# Patient Record
Sex: Female | Born: 1985 | Race: White | Hispanic: Yes | Marital: Married | Smoking: Never smoker
Health system: Southern US, Community
[De-identification: ages and names within clinical notes are randomized; demographics above are authoritative.]

## PROBLEM LIST (undated history)

## (undated) DIAGNOSIS — R000 Tachycardia, unspecified: Principal | ICD-10-CM

## (undated) DIAGNOSIS — O24419 Gestational diabetes mellitus in pregnancy, unspecified control: Secondary | ICD-10-CM

## (undated) DIAGNOSIS — R55 Syncope and collapse: Secondary | ICD-10-CM

## (undated) DIAGNOSIS — I951 Orthostatic hypotension: Principal | ICD-10-CM

## (undated) HISTORY — DX: Tachycardia, unspecified: R00.0

## (undated) HISTORY — PX: NO PAST SURGERIES: SHX2092

## (undated) HISTORY — DX: Gestational diabetes mellitus in pregnancy, unspecified control: O24.419

## (undated) HISTORY — DX: Orthostatic hypotension: I95.1

## (undated) HISTORY — DX: Syncope and collapse: R55

---

## 2004-07-30 ENCOUNTER — Inpatient Hospital Stay (HOSPITAL_COMMUNITY): Admission: AD | Admit: 2004-07-30 | Discharge: 2004-07-30 | Payer: Self-pay | Admitting: *Deleted

## 2004-12-05 DIAGNOSIS — O24419 Gestational diabetes mellitus in pregnancy, unspecified control: Secondary | ICD-10-CM

## 2004-12-05 HISTORY — DX: Gestational diabetes mellitus in pregnancy, unspecified control: O24.419

## 2005-01-21 ENCOUNTER — Encounter: Admission: RE | Admit: 2005-01-21 | Discharge: 2005-01-21 | Payer: Self-pay | Admitting: Obstetrics and Gynecology

## 2005-03-10 ENCOUNTER — Inpatient Hospital Stay (HOSPITAL_COMMUNITY): Admission: AD | Admit: 2005-03-10 | Discharge: 2005-03-10 | Payer: Self-pay | Admitting: Obstetrics & Gynecology

## 2005-03-10 ENCOUNTER — Inpatient Hospital Stay (HOSPITAL_COMMUNITY): Admission: AD | Admit: 2005-03-10 | Discharge: 2005-03-13 | Payer: Self-pay | Admitting: Obstetrics & Gynecology

## 2005-03-11 ENCOUNTER — Ambulatory Visit: Payer: Self-pay | Admitting: Certified Nurse Midwife

## 2005-05-04 LAB — SICKLE CELL SCREEN: SICKLE CELL SCREEN: NORMAL

## 2009-03-13 ENCOUNTER — Ambulatory Visit (HOSPITAL_COMMUNITY): Admission: RE | Admit: 2009-03-13 | Discharge: 2009-03-13 | Payer: Self-pay | Admitting: Family Medicine

## 2009-07-22 ENCOUNTER — Inpatient Hospital Stay (HOSPITAL_COMMUNITY): Admission: AD | Admit: 2009-07-22 | Discharge: 2009-07-24 | Payer: Self-pay | Admitting: Obstetrics & Gynecology

## 2009-07-22 ENCOUNTER — Ambulatory Visit: Payer: Self-pay | Admitting: Family

## 2011-03-12 LAB — CBC
HCT: 41 % (ref 36.0–46.0)
MCHC: 34.5 g/dL (ref 30.0–36.0)
MCV: 95.7 fL (ref 78.0–100.0)
Platelets: 192 10*3/uL (ref 150–400)
RBC: 4.28 MIL/uL (ref 3.87–5.11)
RDW: 14.1 % (ref 11.5–15.5)
WBC: 14.8 10*3/uL — ABNORMAL HIGH (ref 4.0–10.5)

## 2011-03-12 LAB — RPR: RPR Ser Ql: NONREACTIVE

## 2016-01-12 LAB — CYTOLOGY - PAP: Pap: NEGATIVE

## 2016-07-24 ENCOUNTER — Emergency Department (HOSPITAL_COMMUNITY)
Admission: EM | Admit: 2016-07-24 | Discharge: 2016-07-24 | Disposition: A | Discharge status: Home / Self Care | Payer: Self-pay | Attending: Emergency Medicine | Admitting: Emergency Medicine

## 2016-07-24 ENCOUNTER — Encounter (HOSPITAL_COMMUNITY): Payer: Self-pay | Admitting: Emergency Medicine

## 2016-07-24 DIAGNOSIS — L259 Unspecified contact dermatitis, unspecified cause: Secondary | ICD-10-CM | POA: Insufficient documentation

## 2016-07-24 MED ORDER — PREDNISONE 10 MG (21) PO TBPK: 10.0000 mg | ORAL_TABLET | Freq: Every day | ORAL | 0 refills | Status: DC

## 2016-07-24 MED ORDER — DIPHENHYDRAMINE HCL 25 MG PO TABS: 25.0000 mg | ORAL_TABLET | Freq: Four times a day (QID) | ORAL | 0 refills | Status: DC | PRN

## 2016-07-24 NOTE — Discharge Instructions (Signed)
Read the information below.  Use the prescribed medication as directed.  Please discuss all new medications with your pharmacist.  You may return to the Emergency Department at any time for worsening condition or any new symptoms that concern you.   If you develop redness, swelling, pus draining from the wound, difficulty swallowing or breathing, or fevers greater than 100.4, return to the ER immediately for a recheck.

## 2016-07-24 NOTE — ED Triage Notes (Signed)
Pt c/o generalized rash onset Monday after working out in the yard.

## 2016-07-24 NOTE — ED Notes (Signed)
Declined W/C at D/C and was escorted to lobby by RN. 

## 2016-07-24 NOTE — ED Provider Notes (Signed)
[facility] DEPT Provider Note   CSN: [id number] Arrival date & time: 07/24/16  1257  By signing my name below, I, Cassandra Turner, attest that this documentation has been prepared under the direction and in the presence of non-physician practitioner, Clayton Bibles, PA-C. Electronically Signed: Higinio Turner, Scribe. 07/24/2016. 4:33 PM.  History   Chief Complaint Chief Complaint  Patient presents with  . Rash   The history is provided by the patient. No language interpreter was used.   HPI Comments: Cassandra Turner is a 30 y.o. female who presents to the Emergency Department complaining of gradually worsening, pruritic, rash to her bilateral arms, legs and neck that began 6 days ago and worsened today. Pt reports she was outside working in her yard at the onset of her rash. She states her husband was also working with her and notes he has similar symptoms as well. She notes she has applied Ivarest topical ointment and another pink topical with no relief. She denies pain with her rash, purulent discharge, throat pain or difficulty swallowing.   History reviewed. No pertinent past medical history.  There are no active problems to display for this patient.   History reviewed. No pertinent surgical history.  OB History    No data available     Home Medications    Prior to Admission medications   Medication Sig Start Date End Date Taking? Authorizing Provider  diphenhydrAMINE (BENADRYL) 25 MG tablet Take 1 tablet (25 mg total) by mouth every 6 (six) hours as needed for itching (rash). 07/24/16   Clayton Bibles, PA-C  predniSONE (STERAPRED UNI-PAK 21 TAB) 10 MG (21) TBPK tablet Take 1 tablet (10 mg total) by mouth daily. Day 1: take 6 tabs.  Day 2: 5 tabs  Day 3: 4 tabs  Day 4: 3 tabs  Day 5: 2 tabs  Day 6: 1 tab 07/24/16   Clayton Bibles, PA-C    Family History No family history on file.  Social History Social History  Substance Use Topics  . Smoking status: Never Smoker  . Smokeless  tobacco: Never Used  . Alcohol use No   Allergies   Review of patient's allergies indicates no known allergies.  Review of Systems Review of Systems  Constitutional: Negative for chills and fever.  HENT: Negative for sore throat and trouble swallowing.   Respiratory: Negative for shortness of breath, wheezing and stridor.   Musculoskeletal: Negative for arthralgias.  Skin: Positive for rash.  Allergic/Immunologic: Negative for immunocompromised state.  Neurological: Negative for weakness and numbness.  Hematological: Does not bruise/bleed easily.  Psychiatric/Behavioral: Negative for self-injury.   Physical Exam Updated Vital Signs BP 106/74 (BP Location: Left Arm)   Pulse 78   Temp 98.1 F (36.7 C) (Oral)   Resp 18   Ht 5\' 4"  (1.626 m)   Wt 137 lb (62.1 kg)   LMP 07/24/2016   SpO2 100%   BMI 23.52 kg/m   Physical Exam  Constitutional: She appears well-developed and well-nourished. No distress.  HENT:  Head: Normocephalic and atraumatic.  Mouth/Throat: Oropharynx is clear and moist. No oropharyngeal exudate.  Eyes: Conjunctivae are normal.  Neck: Neck supple.  Cardiovascular: Normal rate and regular rhythm.   Pulmonary/Chest: Effort normal and breath sounds normal. No respiratory distress. She has no wheezes. She has no rales.  Neurological: She is alert.  Skin: She is not diaphoretic.  Diffuse erythematous plaques over upper extremities.  No discharge or tenderness.     Nursing note and vitals reviewed.  ED  Treatments / Results  Labs (all labs ordered are listed, but only abnormal results are displayed) Labs Reviewed - No data to display  EKG  EKG Interpretation None       Radiology No results found.  Procedures Procedures  DIAGNOSTIC STUDIES:  Oxygen Saturation is 100% on RA, normal by my interpretation.    COORDINATION OF CARE:  4:30 PM Discussed treatment Turner with pt at bedside and pt agreed to Turner.  Medications Ordered in ED Medications  - No data to display  Initial Impression / Assessment and Turner / ED Course  I have reviewed the triage vital signs and the nursing notes.  Pertinent labs & imaging results that were available during my care of the patient were reviewed by me and considered in my medical decision making (see chart for details).  Clinical Course    Afebrile, nontoxic patient with pruritic rash after exposure in her yard.  Husband with similar rash.  Consistent with contact dermatitis.  Has tried on topical creams.  No systemic symptoms or airway concerns.   D/C home with benadryl, prednisone.  Discussed result, findings, treatment, and follow up  with patient.  Pt given return precautions.  Pt verbalizes understanding and agrees with Turner.       I personally performed the services described in this documentation, which was scribed in my presence. The recorded information has been reviewed and is accurate.   Final Clinical Impressions(s) / ED Diagnoses   Final diagnoses:  Contact dermatitis    New Prescriptions Discharge Medication List as of 07/24/2016  4:34 PM    START taking these medications   Details  diphenhydrAMINE (BENADRYL) 25 MG tablet Take 1 tablet (25 mg total) by mouth every 6 (six) hours as needed for itching (rash)., Starting Sun 07/24/2016, Print    predniSONE (STERAPRED UNI-PAK 21 TAB) 10 MG (21) TBPK tablet Take 1 tablet (10 mg total) by mouth daily. Day 1: take 6 tabs.  Day 2: 5 tabs  Day 3: 4 tabs  Day 4: 3 tabs  Day 5: 2 tabs  Day 6: 1 tab, Starting Sun 07/24/2016, Print         [place], PA-C 07/24/16 [place], DO 07/24/16 2342

## 2016-11-21 LAB — OB RESULTS CONSOLE RPR: RPR: NONREACTIVE

## 2016-11-21 LAB — OB RESULTS CONSOLE PLATELET COUNT: PLATELETS: 345 10*3/uL

## 2016-11-21 LAB — CYSTIC FIBROSIS DIAGNOSTIC STUDY: Interpretation-CFDNA:: NEGATIVE

## 2016-11-21 LAB — OB RESULTS CONSOLE GC/CHLAMYDIA
CHLAMYDIA, DNA PROBE: NEGATIVE
GC PROBE AMP, GENITAL: NEGATIVE

## 2016-11-21 LAB — CULTURE, OB URINE: URINE CULTURE, OB: NO GROWTH

## 2016-11-21 LAB — OB RESULTS CONSOLE HGB/HCT, BLOOD
HCT: 39 %
Hemoglobin: 12.9 g/dL

## 2016-11-21 LAB — GLUCOSE, 1 HOUR: Glucose 1 Hour: 138

## 2016-11-21 LAB — OB RESULTS CONSOLE ANTIBODY SCREEN: ANTIBODY SCREEN: NEGATIVE

## 2016-11-21 LAB — OB RESULTS CONSOLE ABO/RH: RH Type: POSITIVE

## 2016-11-21 LAB — OB RESULTS CONSOLE RUBELLA ANTIBODY, IGM: Rubella: IMMUNE

## 2016-11-21 LAB — OB RESULTS CONSOLE HIV ANTIBODY (ROUTINE TESTING): HIV: NONREACTIVE

## 2016-11-21 LAB — OB RESULTS CONSOLE HEPATITIS B SURFACE ANTIGEN: Hepatitis B Surface Ag: NEGATIVE

## 2016-11-21 LAB — OB RESULTS CONSOLE VARICELLA ZOSTER ANTIBODY, IGG: Varicella: IMMUNE

## 2016-11-23 LAB — GLUCOSE, 3 HOUR

## 2016-12-05 NOTE — L&D Delivery Note (Signed)
Delivery Note At 10:11 PM a viable female was delivered via Vaginal, Spontaneous Delivery (Presentation: ROA).  APGAR: 9, 9; weight pending.   Placenta status: Spontaneous, intact.  Cord: 3 vessels  Anesthesia: epidural  Episiotomy: None Lacerations: None Suture Repair: n/a Est. Blood Loss (mL): 150  Mom to postpartum.  Baby to Couplet care / Skin to Skin.  [name] SNM 05/13/2017, 10:44 PM  Cassandra Turner is a 31 y.o. female G3P2002 with IUP at [redacted]w[redacted]d admitted for spontaneous onset of labor.  She progressed with pitocin augmentation to complete and pushed 30 minutes to deliver.  Cord clamping delayed by several minutes then clamped by CNM and cut by father of baby.  Placenta intact and spontaneous, bleeding minimal.  No laceration.   Mom and baby stable prior to transfer to postpartum. She plans on breastfeeding. She requests pills for birth control.   I was gloved and present for entire delivery SVD without incident No difficulty with shoulders No lacerations Seabron Spates, CNM

## 2016-12-09 ENCOUNTER — Encounter: Payer: Self-pay | Admitting: *Deleted

## 2016-12-12 ENCOUNTER — Ambulatory Visit: Payer: Self-pay | Admitting: *Deleted

## 2016-12-12 ENCOUNTER — Encounter: Payer: Self-pay | Attending: Obstetrics & Gynecology | Admitting: *Deleted

## 2016-12-12 DIAGNOSIS — Z3A00 Weeks of gestation of pregnancy not specified: Secondary | ICD-10-CM | POA: Insufficient documentation

## 2016-12-12 DIAGNOSIS — O24419 Gestational diabetes mellitus in pregnancy, unspecified control: Secondary | ICD-10-CM | POA: Insufficient documentation

## 2016-12-12 DIAGNOSIS — Z713 Dietary counseling and surveillance: Secondary | ICD-10-CM | POA: Insufficient documentation

## 2016-12-12 NOTE — Progress Notes (Signed)
  Patient was seen on 12/12/2016 for Gestational Diabetes self-management visit.  She is here with husband and Spanish interpretor The following learning objectives were met by the patient during this course in spanish:   States the definition of Gestational Diabetes  States why dietary management is important in controlling blood glucose  Describes the effects each nutrient has on blood glucose levels  Demonstrates ability to create a balanced meal plan  Demonstrates carbohydrate counting   States when to check blood glucose levels  Demonstrates proper blood glucose monitoring techniques  States the effect of stress and exercise on blood glucose levels  States the importance of limiting caffeine and abstaining from alcohol and smoking  Blood glucose monitor given: [employer] Lot # [id number] Exp: 03/04/2018 Blood glucose reading: 110 @ 2 hours after breakfast  Patient instructed to monitor glucose levels: FBS: 60 - <90 2 hour: <120  *Patient received handouts:  Nutrition Diabetes and Pregnancy  Carbohydrate Counting List  Patient will be seen for follow-up as needed.

## 2016-12-19 ENCOUNTER — Ambulatory Visit (INDEPENDENT_AMBULATORY_CARE_PROVIDER_SITE_OTHER): Payer: Medicaid Other | Admitting: Obstetrics & Gynecology

## 2016-12-19 ENCOUNTER — Encounter: Payer: Self-pay | Admitting: Obstetrics & Gynecology

## 2016-12-19 VITALS — BP 102/70 | HR 68 | Wt 140.3 lb

## 2016-12-19 DIAGNOSIS — O24419 Gestational diabetes mellitus in pregnancy, unspecified control: Secondary | ICD-10-CM | POA: Insufficient documentation

## 2016-12-19 DIAGNOSIS — O24912 Unspecified diabetes mellitus in pregnancy, second trimester: Secondary | ICD-10-CM

## 2016-12-19 DIAGNOSIS — O0990 Supervision of high risk pregnancy, unspecified, unspecified trimester: Secondary | ICD-10-CM | POA: Insufficient documentation

## 2016-12-19 DIAGNOSIS — Z3689 Encounter for other specified antenatal screening: Secondary | ICD-10-CM

## 2016-12-19 LAB — POCT URINALYSIS DIP (DEVICE)
Bilirubin Urine: NEGATIVE
GLUCOSE, UA: NEGATIVE mg/dL
Hgb urine dipstick: NEGATIVE
Ketones, ur: NEGATIVE mg/dL
Nitrite: NEGATIVE
PROTEIN: NEGATIVE mg/dL
SPECIFIC GRAVITY, URINE: 1.02 (ref 1.005–1.030)
UROBILINOGEN UA: 0.2 mg/dL (ref 0.0–1.0)
pH: 6 (ref 5.0–8.0)

## 2016-12-19 LAB — HEMOGLOBIN A1C
Hgb A1c MFr Bld: 5.5 % (ref <5.7)
MEAN PLASMA GLUCOSE: 111 mg/dL

## 2016-12-19 MED ORDER — ASPIRIN EC 81 MG PO TBEC: 81.0000 mg | DELAYED_RELEASE_TABLET | Freq: Every day | ORAL | 7 refills | Status: DC

## 2016-12-19 NOTE — Progress Notes (Signed)
Spanish Interpreter Lockie Mola  New ob packet given  Flu vaccine given from [facility]  Anatomy US scheduled for  January 24th @ 1030.  Pt notified. RN Zenaida Niece    PRENATAL VISIT NOTE  Subjective:  Cassandra Turner is a 31 y.o. G3P2002 at [redacted]w[redacted]d being seen today for ongoing prenatal care.  She is currently monitored for the following issues for this high-risk pregnancy and has Pregnancy, supervision, high-risk on her problem list.  Patient reports no problems.  Following diet and checking CBGs.  Contractions: Not present. Vag. Bleeding: None.  Movement: Present. Denies leaking of fluid.   The following portions of the patient's history were reviewed and updated as appropriate: allergies, current medications, past family history, past medical history, past social history, past surgical history and problem list. Problem list updated.  Objective:   Vitals:   12/19/16 0811  BP: 102/70  Pulse: 68  Weight: 140 lb 4.8 oz (63.6 kg)    Fetal Status: Fetal Heart Rate (bpm): 152   Movement: Present     General:  Alert, oriented and cooperative. Patient is in no acute distress.  Skin: Skin is warm and dry. No rash noted.   Cardiovascular: Normal heart rate noted  Respiratory: Normal respiratory effort, no problems with respiration noted  Abdomen: Soft, gravid, appropriate for gestational age. Pain/Pressure: Absent     Pelvic:  Cervical exam deferred        Extremities: Normal range of motion.  Edema: None  Mental Status: Normal mood and affect. Normal behavior. Normal judgment and thought content.   Assessment and Plan:  Pregnancy: G3P2002 at [redacted]w[redacted]d  1. Supervision of high risk pregnancy, antepartum -All fasting and pp breakfast nad lunch are WNL.  There are some 120s and 130s after dinner.  Pt encouraged to decrease carbs and take a walk after dinner meal.  - HgB A1c - AFP, Quad Screen - Baby ASA  2. Encounter for fetal anatomic survey - [place] MFM OB COMP + 14 WK; Future  Preterm  labor symptoms and general obstetric precautions including but not limited to vaginal bleeding, contractions, leaking of fluid and fetal movement were reviewed in detail with the patient. Please refer to After Visit Summary for other counseling recommendations.  Return in 3 weeks (on 01/09/2017).   Guss Bunde, MD

## 2016-12-20 ENCOUNTER — Encounter (HOSPITAL_COMMUNITY): Payer: Self-pay | Admitting: Obstetrics & Gynecology

## 2016-12-20 LAB — AFP, QUAD SCREEN
AFP: 63 ng/mL
Age Alone: 1:483 {titer}
CURR GEST AGE: 17.6 wk
Down Syndrome Scr Risk Est: <1:38500 {titer}
HCG, Total: 13.02 IU/mL
INH: 142.7 pg/mL
INTERPRETATION-AFP: NEGATIVE
MOM FOR AFP: 1.55
MoM for INH: 1.02
MoM for hCG: 0.51
Open Spina bifida: NEGATIVE
Osb Risk: 1:2420 {titer}
TRI 18 SCR RISK EST: NEGATIVE
UE3 VALUE: 1.27 ng/mL
uE3 Mom: 0.9

## 2016-12-28 ENCOUNTER — Other Ambulatory Visit: Payer: Self-pay | Admitting: Obstetrics & Gynecology

## 2016-12-28 ENCOUNTER — Encounter (HOSPITAL_COMMUNITY): Payer: Self-pay

## 2016-12-28 ENCOUNTER — Ambulatory Visit (HOSPITAL_COMMUNITY)
Admission: RE | Admit: 2016-12-28 | Discharge: 2016-12-28 | Disposition: A | Discharge status: Home / Self Care | Payer: Medicaid Other | Source: Ambulatory Visit | Attending: Obstetrics & Gynecology | Admitting: Obstetrics & Gynecology

## 2016-12-28 DIAGNOSIS — O0990 Supervision of high risk pregnancy, unspecified, unspecified trimester: Secondary | ICD-10-CM

## 2016-12-28 DIAGNOSIS — Z3689 Encounter for other specified antenatal screening: Secondary | ICD-10-CM

## 2016-12-28 DIAGNOSIS — O24410 Gestational diabetes mellitus in pregnancy, diet controlled: Secondary | ICD-10-CM | POA: Diagnosis not present

## 2016-12-28 DIAGNOSIS — Z3A18 18 weeks gestation of pregnancy: Secondary | ICD-10-CM | POA: Diagnosis not present

## 2016-12-28 DIAGNOSIS — Z363 Encounter for antenatal screening for malformations: Secondary | ICD-10-CM | POA: Insufficient documentation

## 2016-12-29 ENCOUNTER — Telehealth: Payer: Self-pay

## 2016-12-29 NOTE — Telephone Encounter (Signed)
Pt called and stated that she checked her BS and it was high so she rechecked x 3 and the values where different.  Contacted pt with [place] and pt stated that she took her BS this am and it was 140 so she rechecked it three times repeatedly after.  I asked pt what she ate she informed me two tortillas.  I explained to the pt that tortillas have carbs which could have elevated her BS.  I also explained to the pt to please check her BS the one time and obtain its value.  Please do not repeatedly check once obtained value.  Pt stated understanding with no further questions.

## 2017-01-09 ENCOUNTER — Ambulatory Visit (INDEPENDENT_AMBULATORY_CARE_PROVIDER_SITE_OTHER): Payer: Self-pay | Admitting: Obstetrics and Gynecology

## 2017-01-09 ENCOUNTER — Encounter: Payer: Self-pay | Admitting: Family Medicine

## 2017-01-09 VITALS — BP 94/54 | HR 78 | Wt 138.1 lb

## 2017-01-09 DIAGNOSIS — O24912 Unspecified diabetes mellitus in pregnancy, second trimester: Secondary | ICD-10-CM

## 2017-01-09 DIAGNOSIS — O0992 Supervision of high risk pregnancy, unspecified, second trimester: Secondary | ICD-10-CM

## 2017-01-09 DIAGNOSIS — O24419 Gestational diabetes mellitus in pregnancy, unspecified control: Secondary | ICD-10-CM

## 2017-01-09 LAB — POCT URINALYSIS DIP (DEVICE)
Bilirubin Urine: NEGATIVE
Glucose, UA: NEGATIVE mg/dL
KETONES UR: NEGATIVE mg/dL
Nitrite: NEGATIVE
PROTEIN: NEGATIVE mg/dL
SPECIFIC GRAVITY, URINE: 1.015 (ref 1.005–1.030)
UROBILINOGEN UA: 0.2 mg/dL (ref 0.0–1.0)
pH: 6.5 (ref 5.0–8.0)

## 2017-01-09 NOTE — Progress Notes (Signed)
Subjective:  Cassandra Turner is a 31 y.o. G3P2002 at [redacted]w[redacted]d being seen today for ongoing prenatal care.  She is currently monitored for the following issues for this high-risk pregnancy and has Pregnancy, supervision, high-risk and Gestational diabetes mellitus (GDM) affecting pregnancy, antepartum on her problem list.  Patient reports no complaints.  Contractions: Not present. Vag. Bleeding: None.  Movement: Present. Denies leaking of fluid.   The following portions of the patient's history were reviewed and updated as appropriate: allergies, current medications, past family history, past medical history, past social history, past surgical history and problem list. Problem list updated.  Objective:   Vitals:   01/09/17 0745  BP: (!) 94/54  Pulse: 78  Weight: 62.6 kg (138 lb 1.6 oz)    Fetal Status: Fetal Heart Rate (bpm): 154   Movement: Present     General:  Alert, oriented and cooperative. Patient is in no acute distress.  Skin: Skin is warm and dry. No rash noted.   Cardiovascular: Normal heart rate noted  Respiratory: Normal respiratory effort, no problems with respiration noted  Abdomen: Soft, gravid, appropriate for gestational age. Pain/Pressure: Absent     Pelvic:  Cervical exam deferred        Extremities: Normal range of motion.  Edema: None  Mental Status: Normal mood and affect. Normal behavior. Normal judgment and thought content.   Urinalysis: Urine Protein: Negative Urine Glucose: Negative  Assessment and Plan:  Pregnancy: G3P2002 at [redacted]w[redacted]d  1. Supervision of high risk pregnancy in second trimester Stable Continue with PNV and BASA  2. Gestational diabetes mellitus (GDM) affecting pregnancy, antepartum BS are in goal range except for a few Pp. Diet related. Importance diet reviewed with pt Interrupter used during today's visit  Preterm labor symptoms and general obstetric precautions including but not limited to vaginal bleeding, contractions, leaking of  fluid and fetal movement were reviewed in detail with the patient. Please refer to After Visit Summary for other counseling recommendations.  Return in about 4 weeks (around 02/06/2017) for OB visit.   Chancy Milroy, MD

## 2017-01-09 NOTE — Progress Notes (Signed)
Spanish interpreter Mikle Bosworth present for visit

## 2017-01-09 NOTE — Patient Instructions (Signed)

## 2017-02-08 ENCOUNTER — Ambulatory Visit (INDEPENDENT_AMBULATORY_CARE_PROVIDER_SITE_OTHER): Payer: Self-pay | Admitting: Obstetrics and Gynecology

## 2017-02-08 VITALS — BP 110/65 | HR 85 | Wt 141.6 lb

## 2017-02-08 DIAGNOSIS — O24419 Gestational diabetes mellitus in pregnancy, unspecified control: Secondary | ICD-10-CM

## 2017-02-08 DIAGNOSIS — O24919 Unspecified diabetes mellitus in pregnancy, unspecified trimester: Secondary | ICD-10-CM

## 2017-02-08 DIAGNOSIS — O0992 Supervision of high risk pregnancy, unspecified, second trimester: Secondary | ICD-10-CM

## 2017-02-08 LAB — POCT URINALYSIS DIP (DEVICE)
Bilirubin Urine: NEGATIVE
GLUCOSE, UA: NEGATIVE mg/dL
HGB URINE DIPSTICK: NEGATIVE
KETONES UR: NEGATIVE mg/dL
Leukocytes, UA: NEGATIVE
Nitrite: NEGATIVE
PROTEIN: NEGATIVE mg/dL
SPECIFIC GRAVITY, URINE: 1.01 (ref 1.005–1.030)
Urobilinogen, UA: 0.2 mg/dL (ref 0.0–1.0)
pH: 6 (ref 5.0–8.0)

## 2017-02-08 MED ORDER — GLYBURIDE 2.5 MG PO TABS: 2.5000 mg | ORAL_TABLET | Freq: Every day | ORAL | 3 refills | Status: DC

## 2017-02-08 NOTE — Patient Instructions (Signed)
Segundo trimestre de embarazo (Second Trimester of Pregnancy) El segundo trimestre va desde la semana13 hasta la 28, desde el cuarto hasta el sexto mes, y suele ser el momento en el que mejor se siente. En general, las nuseas matutinas han disminuido o han desaparecido completamente. Tendr ms energa y podr aumentarle el apetito. El beb por nacer (feto) se desarrolla rpidamente. Hacia el final del sexto mes, el beb mide aproximadamente 9 pulgadas (23 cm) y pesa alrededor de 1 libras (700 g). Es probable que sienta al beb moverse (dar pataditas) entre las 18 y 20 semanas del embarazo. CUIDADOS EN EL HOGAR  No fume, no consuma hierbas ni beba alcohol. No tome frmacos que el mdico no haya autorizado.  No consuma ningn producto que contenga tabaco, lo que incluye cigarrillos, tabaco de mascar o cigarrillos electrnicos. Si necesita ayuda para dejar de fumar, consulte al mdico. Puede recibir asesoramiento u otro tipo de apoyo para dejar de fumar.  Tome los medicamentos solamente como se lo haya indicado el mdico. Algunos medicamentos son seguros para tomar durante el embarazo y otros no lo son.  Haga ejercicios solamente como se lo haya indicado el mdico. Interrumpa la actividad fsica si comienza a tener calambres.  Ingiera alimentos saludables de manera regular.  Use un sostn que le brinde buen soporte si sus mamas estn sensibles.  No se d baos de inmersin en agua caliente, baos turcos ni saunas.  Colquese el cinturn de seguridad cuando conduzca.  No coma carne cruda ni queso sin cocinar; evite el contacto con las bandejas sanitarias de los gatos y la tierra que estos animales usan.  Tome las vitaminas prenatales.  Tome entre 1500 y 2000mg de calcio diariamente comenzando en la semana20 del embarazo hasta el parto.  Pruebe tomar un medicamento que la ayude a defecar (un laxante suave) si el mdico lo autoriza. Consuma ms fibra, que se encuentra en las frutas y  verduras frescas y los cereales integrales. Beba suficiente lquido para mantener el pis (orina) claro o de color amarillo plido.  Dese baos de asiento con agua tibia para aliviar el dolor o las molestias causadas por las hemorroides. Use una crema para las hemorroides si el mdico la autoriza.  Si se le hinchan las venas (venas varicosas), use medias de descanso. Levante (eleve) los pies durante 15minutos, 3 o 4veces por da. Limite el consumo de sal en su dieta.  No levante objetos pesados, use zapatos de tacones bajos y sintese derecha.  Descanse con las piernas elevadas si tiene calambres o dolor de cintura.  Visite a su dentista si no lo ha hecho durante el embarazo. Use un cepillo de cerdas suaves para cepillarse los dientes. Psese el hilo dental con suavidad.  Puede seguir manteniendo relaciones sexuales, a menos que el mdico le indique lo contrario.  Concurra a los controles mdicos.  SOLICITE AYUDA SI:  Siente mareos.  Sufre calambres o presin leves en la parte baja del vientre (abdomen).  Sufre un dolor persistente en el abdomen.  Tiene malestar estomacal (nuseas), vmitos, o tiene deposiciones acuosas (diarrea).  Advierte un olor ftido que proviene de la vagina.  Siente dolor al orinar.  SOLICITE AYUDA DE INMEDIATO SI:  Tiene fiebre.  Tiene una prdida de lquido por la vagina.  Tiene sangrado o pequeas prdidas vaginales.  Siente dolor intenso o clicos en el abdomen.  Sube o baja de peso rpidamente.  Tiene dificultades para recuperar el aliento y siente dolor en el pecho.  Sbitamente se   le hinchan mucho el rostro, las manos, los tobillos, los pies o las piernas.  No ha sentido los movimientos del beb durante una hora.  Siente un dolor de cabeza intenso que no se alivia con medicamentos.  Su visin se modifica.  Esta informacin no tiene como fin reemplazar el consejo del mdico. Asegrese de hacerle al mdico cualquier pregunta que  tenga. Document Released: 07/24/2013 Document Revised: 12/12/2014 Document Reviewed: 01/22/2013 Elsevier Interactive Patient Education  2017 Elsevier Inc.  

## 2017-02-08 NOTE — Progress Notes (Signed)
Video Interpreter # [phone]

## 2017-02-08 NOTE — Progress Notes (Signed)
Subjective:  Cassandra Turner is a 31 y.o. G3P2002 at [redacted]w[redacted]d being seen today for ongoing prenatal care.  She is currently monitored for the following issues for this high-risk pregnancy and has Pregnancy, supervision, high-risk and Gestational diabetes mellitus (GDM) affecting pregnancy, antepartum on her problem list.  Patient reports no complaints.  Contractions: Not present. Vag. Bleeding: None.  Movement: Present. Denies leaking of fluid.   The following portions of the patient's history were reviewed and updated as appropriate: allergies, current medications, past family history, past medical history, past social history, past surgical history and problem list. Problem list updated.  Objective:   Vitals:   02/08/17 0843  BP: 110/65  Pulse: 85  Weight: 141 lb 9.6 oz (64.2 kg)    Fetal Status: Fetal Heart Rate (bpm): 153   Movement: Present     General:  Alert, oriented and cooperative. Patient is in no acute distress.  Skin: Skin is warm and dry. No rash noted.   Cardiovascular: Normal heart rate noted  Respiratory: Normal respiratory effort, no problems with respiration noted  Abdomen: Soft, gravid, appropriate for gestational age. Pain/Pressure: Absent     Pelvic:  Cervical exam deferred        Extremities: Normal range of motion.  Edema: None  Mental Status: Normal mood and affect. Normal behavior. Normal judgment and thought content.   Urinalysis:      Assessment and Plan:  Pregnancy: G3P2002 at [redacted]w[redacted]d  1. Supervision of high risk pregnancy in second trimester  - glyBURIDE (DIABETA) 2.5 MG tablet; Take 1 tablet (2.5 mg total) by mouth daily with breakfast.  Dispense: 60 tablet; Refill: 3  2. Gestational diabetes mellitus (GDM) affecting pregnancy, antepartum 2 hr PP BS have increased from last visit Pt reports following diet Will start Diabeta as noted above  Preterm labor symptoms and general obstetric precautions including but not limited to vaginal bleeding,  contractions, leaking of fluid and fetal movement were reviewed in detail with the patient. Please refer to After Visit Summary for other counseling recommendations.  Return in about 4 weeks (around 03/08/2017) for OB visit.   Chancy Milroy, MD

## 2017-03-08 ENCOUNTER — Ambulatory Visit (INDEPENDENT_AMBULATORY_CARE_PROVIDER_SITE_OTHER): Payer: Self-pay | Admitting: Obstetrics and Gynecology

## 2017-03-08 VITALS — BP 96/62 | HR 76 | Wt 145.5 lb

## 2017-03-08 DIAGNOSIS — O24415 Gestational diabetes mellitus in pregnancy, controlled by oral hypoglycemic drugs: Secondary | ICD-10-CM

## 2017-03-08 DIAGNOSIS — O0993 Supervision of high risk pregnancy, unspecified, third trimester: Secondary | ICD-10-CM

## 2017-03-08 DIAGNOSIS — Z23 Encounter for immunization: Secondary | ICD-10-CM

## 2017-03-08 LAB — POCT URINALYSIS DIP (DEVICE)
Bilirubin Urine: NEGATIVE
GLUCOSE, UA: NEGATIVE mg/dL
Hgb urine dipstick: NEGATIVE
KETONES UR: NEGATIVE mg/dL
LEUKOCYTES UA: NEGATIVE
Nitrite: NEGATIVE
Protein, ur: NEGATIVE mg/dL
SPECIFIC GRAVITY, URINE: 1.015 (ref 1.005–1.030)
UROBILINOGEN UA: 0.2 mg/dL (ref 0.0–1.0)
pH: 6.5 (ref 5.0–8.0)

## 2017-03-08 NOTE — Progress Notes (Signed)
28 week labs, tdap today Spanish video interpreter "Katherin" [phone] used for visit

## 2017-03-08 NOTE — Progress Notes (Signed)
   PRENATAL VISIT NOTE  Subjective:  Cassandra Turner is a 31 y.o. G3P2002 at [redacted]w[redacted]d being seen today for ongoing prenatal care.  She is currently monitored for the following issues for this high-risk pregnancy and has Pregnancy, supervision, high-risk and Gestational diabetes mellitus (GDM) affecting pregnancy, antepartum on her problem list.    Patient reports no complaints.  Contractions: Not present. Vag. Bleeding: None.  Movement: Present. Denies leaking of fluid.  Reviewed log book> all CBGs within target range. On Glyburide 2.5mg  q am.   The following portions of the patient's history were reviewed and updated as appropriate: allergies, current medications, past family history, past medical history, past social history, past surgical history and problem list. Problem list updated.                Objective:   Vitals:   03/08/17 0756  BP: 96/62  Pulse: 76  Weight: 145 lb 8 oz (66 kg)    Fetal Status: Fetal Heart Rate (bpm): 145   Movement: Present     General:  Alert, oriented and cooperative. Patient is in no acute distress.  Skin: Skin is warm and dry. No rash noted.   Cardiovascular: Normal heart rate noted  Respiratory: Normal respiratory effort, no problems with respiration noted  Abdomen: Soft, gravid, appropriate for gestational age. Pain/Pressure: Present     Pelvic:  Cervical exam deferred        Extremities: Normal range of motion.  Edema: None  Mental Status: Normal mood and affect. Normal behavior. Normal judgment and thought content.   Assessment and Plan:  Pregnancy: G3P2002 at [redacted]w[redacted]d  1. Supervision of high risk pregnancy in third trimester Doing well - CBC - HIV antibody (with reflex) - RPR  2. Need for Tdap vaccination  - Tdap vaccine greater than or equal to 7yo IM  3. A2 GDM Continue Glyburide  Preterm labor symptoms and general obstetric precautions including but not limited to vaginal bleeding, contractions, leaking of fluid  and fetal movement were reviewed in detail with the patient. Please refer to After Visit Summary for other counseling recommendations.  Return in about 1 week (around 03/15/2017).   Lorene Dy, CNM

## 2017-03-08 NOTE — Patient Instructions (Signed)
Third Trimester of Pregnancy The third trimester is from week 28 through week 40 (months 7 through 9). The third trimester is a time when the unborn baby (fetus) is growing rapidly. At the end of the ninth month, the fetus is about 20 inches in length and weighs 6-10 pounds. Body changes during your third trimester Your body will continue to go through many changes during pregnancy. The changes vary from woman to woman. During the third trimester:  Your weight will continue to increase. You can expect to gain 25-35 pounds (11-16 kg) by the end of the pregnancy.  You may begin to get stretch marks on your hips, abdomen, and breasts.  You may urinate more often because the fetus is moving lower into your pelvis and pressing on your bladder.  You may develop or continue to have heartburn. This is caused by increased hormones that slow down muscles in the digestive tract.  You may develop or continue to have constipation because increased hormones slow digestion and cause the muscles that push waste through your intestines to relax.  You may develop hemorrhoids. These are swollen veins (varicose veins) in the rectum that can itch or be painful.  You may develop swollen, bulging veins (varicose veins) in your legs.  You may have increased body aches in the pelvis, back, or thighs. This is due to weight gain and increased hormones that are relaxing your joints.  You may have changes in your hair. These can include thickening of your hair, rapid growth, and changes in texture. Some women also have hair loss during or after pregnancy, or hair that feels dry or thin. Your hair will most likely return to normal after your baby is born.  Your breasts will continue to grow and they will continue to become tender. A yellow fluid (colostrum) may leak from your breasts. This is the first milk you are producing for your baby.  Your belly button may stick out.  You may notice more swelling in your hands,  face, or ankles.  You may have increased tingling or numbness in your hands, arms, and legs. The skin on your belly may also feel numb.  You may feel short of breath because of your expanding uterus.  You may have more problems sleeping. This can be caused by the size of your belly, increased need to urinate, and an increase in your body's metabolism.  You may notice the fetus "dropping," or moving lower in your abdomen (lightening).  You may have increased vaginal discharge.  You may notice your joints feel loose and you may have pain around your pelvic bone.  What to expect at prenatal visits You will have prenatal exams every 2 weeks until week 36. Then you will have weekly prenatal exams. During a routine prenatal visit:  You will be weighed to make sure you and the baby are growing normally.  Your blood pressure will be taken.  Your abdomen will be measured to track your baby's growth.  The fetal heartbeat will be listened to.  Any test results from the previous visit will be discussed.  You may have a cervical check near your due date to see if your cervix has softened or thinned (effaced).  You will be tested for Group B streptococcus. This happens between 35 and 37 weeks.  Your health care provider may ask you:  What your birth plan is.  How you are feeling.  If you are feeling the baby move.  If you have had   any abnormal symptoms, such as leaking fluid, bleeding, severe headaches, or abdominal cramping.  If you are using any tobacco products, including cigarettes, chewing tobacco, and electronic cigarettes.  If you have any questions.  Other tests or screenings that may be performed during your third trimester include:  Blood tests that check for low iron levels (anemia).  Fetal testing to check the health, activity level, and growth of the fetus. Testing is done if you have certain medical conditions or if there are problems during the  pregnancy.  Nonstress test (NST). This test checks the health of your baby to make sure there are no signs of problems, such as the baby not getting enough oxygen. During this test, a belt is placed around your belly. The baby is made to move, and its heart rate is monitored during movement.  What is false labor? False labor is a condition in which you feel small, irregular tightenings of the muscles in the womb (contractions) that usually go away with rest, changing position, or drinking water. These are called Braxton Hicks contractions. Contractions may last for hours, days, or even weeks before true labor sets in. If contractions come at regular intervals, become more frequent, increase in intensity, or become painful, you should see your health care provider. What are the signs of labor?  Abdominal cramps.  Regular contractions that start at 10 minutes apart and become stronger and more frequent with time.  Contractions that start on the top of the uterus and spread down to the lower abdomen and back.  Increased pelvic pressure and dull back pain.  A watery or bloody mucus discharge that comes from the vagina.  Leaking of amniotic fluid. This is also known as your "water breaking." It could be a slow trickle or a gush. Let your health care provider know if it has a color or strange odor. If you have any of these signs, call your health care provider right away, even if it is before your due date. Follow these instructions at home: Medicines  Follow your health care provider's instructions regarding medicine use. Specific medicines may be either safe or unsafe to take during pregnancy.  Take a prenatal vitamin that contains at least 600 micrograms (mcg) of folic acid.  If you develop constipation, try taking a stool softener if your health care provider approves. Eating and drinking  Eat a balanced diet that includes fresh fruits and vegetables, whole grains, good sources of protein  such as meat, eggs, or tofu, and low-fat dairy. Your health care provider will help you determine the amount of weight gain that is right for you.  Avoid raw meat and uncooked cheese. These carry germs that can cause birth defects in the baby.  If you have low calcium intake from food, talk to your health care provider about whether you should take a daily calcium supplement.  Eat four or five small meals rather than three large meals a day.  Limit foods that are high in fat and processed sugars, such as fried and sweet foods.  To prevent constipation: ? Drink enough fluid to keep your urine clear or pale yellow. ? Eat foods that are high in fiber, such as fresh fruits and vegetables, whole grains, and beans. Activity  Exercise only as directed by your health care provider. Most women can continue their usual exercise routine during pregnancy. Try to exercise for 30 minutes at least 5 days a week. Stop exercising if you experience uterine contractions.  Avoid heavy   lifting.  Do not exercise in extreme heat or humidity, or at high altitudes.  Wear low-heel, comfortable shoes.  Practice good posture.  You may continue to have sex unless your health care provider tells you otherwise. Relieving pain and discomfort  Take frequent breaks and rest with your legs elevated if you have leg cramps or low back pain.  Take warm sitz baths to soothe any pain or discomfort caused by hemorrhoids. Use hemorrhoid cream if your health care provider approves.  Wear a good support bra to prevent discomfort from breast tenderness.  If you develop varicose veins: ? Wear support pantyhose or compression stockings as told by your healthcare provider. ? Elevate your feet for 15 minutes, 3-4 times a day. Prenatal care  Write down your questions. Take them to your prenatal visits.  Keep all your prenatal visits as told by your health care provider. This is important. Safety  Wear your seat belt at  all times when driving.  Make a list of emergency phone numbers, including numbers for family, friends, the hospital, and police and fire departments. General instructions  Avoid cat litter boxes and soil used by cats. These carry germs that can cause birth defects in the baby. If you have a cat, ask someone to clean the litter box for you.  Do not travel far distances unless it is absolutely necessary and only with the approval of your health care provider.  Do not use hot tubs, steam rooms, or saunas.  Do not drink alcohol.  Do not use any products that contain nicotine or tobacco, such as cigarettes and e-cigarettes. If you need help quitting, ask your health care provider.  Do not use any medicinal herbs or unprescribed drugs. These chemicals affect the formation and growth of the baby.  Do not douche or use tampons or scented sanitary pads.  Do not cross your legs for long periods of time.  To prepare for the arrival of your baby: ? Take prenatal classes to understand, practice, and ask questions about labor and delivery. ? Make a trial run to the hospital. ? Visit the hospital and tour the maternity area. ? Arrange for maternity or paternity leave through employers. ? Arrange for family and friends to take care of pets while you are in the hospital. ? Purchase a rear-facing car seat and make sure you know how to install it in your car. ? Pack your hospital bag. ? Prepare the baby's nursery. Make sure to remove all pillows and stuffed animals from the baby's crib to prevent suffocation.  Visit your dentist if you have not gone during your pregnancy. Use a soft toothbrush to brush your teeth and be gentle when you floss. Contact a health care provider if:  You are unsure if you are in labor or if your water has broken.  You become dizzy.  You have mild pelvic cramps, pelvic pressure, or nagging pain in your abdominal area.  You have lower back pain.  You have persistent  nausea, vomiting, or diarrhea.  You have an unusual or bad smelling vaginal discharge.  You have pain when you urinate. Get help right away if:  Your water breaks before 37 weeks.  You have regular contractions less than 5 minutes apart before 37 weeks.  You have a fever.  You are leaking fluid from your vagina.  You have spotting or bleeding from your vagina.  You have severe abdominal pain or cramping.  You have rapid weight loss or weight gain.    You have shortness of breath with chest pain.  You notice sudden or extreme swelling of your face, hands, ankles, feet, or legs.  Your baby makes fewer than 10 movements in 2 hours.  You have severe headaches that do not go away when you take medicine.  You have vision changes. Summary  The third trimester is from week 28 through week 40, months 7 through 9. The third trimester is a time when the unborn baby (fetus) is growing rapidly.  During the third trimester, your discomfort may increase as you and your baby continue to gain weight. You may have abdominal, leg, and back pain, sleeping problems, and an increased need to urinate.  During the third trimester your breasts will keep growing and they will continue to become tender. A yellow fluid (colostrum) may leak from your breasts. This is the first milk you are producing for your baby.  False labor is a condition in which you feel small, irregular tightenings of the muscles in the womb (contractions) that eventually go away. These are called Braxton Hicks contractions. Contractions may last for hours, days, or even weeks before true labor sets in.  Signs of labor can include: abdominal cramps; regular contractions that start at 10 minutes apart and become stronger and more frequent with time; watery or bloody mucus discharge that comes from the vagina; increased pelvic pressure and dull back pain; and leaking of amniotic fluid. This information is not intended to replace advice  given to you by your health care provider. Make sure you discuss any questions you have with your health care provider. Document Released: 11/15/2001 Document Revised: 04/28/2016 Document Reviewed: 01/22/2013 Elsevier Interactive Patient Education  2017 [employer]  

## 2017-03-09 LAB — CBC
HEMATOCRIT: 35.6 % (ref 34.0–46.6)
Hemoglobin: 12.2 g/dL (ref 11.1–15.9)
MCH: 32.2 pg (ref 26.6–33.0)
MCHC: 34.3 g/dL (ref 31.5–35.7)
MCV: 94 fL (ref 79–97)
Platelets: 276 10*3/uL (ref 150–379)
RBC: 3.79 x10E6/uL (ref 3.77–5.28)
RDW: 14.7 % (ref 12.3–15.4)
WBC: 8.3 10*3/uL (ref 3.4–10.8)

## 2017-03-09 LAB — RPR: RPR: NONREACTIVE

## 2017-03-09 LAB — HIV ANTIBODY (ROUTINE TESTING W REFLEX): HIV Screen 4th Generation wRfx: NONREACTIVE

## 2017-03-20 ENCOUNTER — Ambulatory Visit (INDEPENDENT_AMBULATORY_CARE_PROVIDER_SITE_OTHER): Payer: Self-pay | Admitting: Family Medicine

## 2017-03-20 VITALS — BP 102/68 | HR 79 | Wt 150.9 lb

## 2017-03-20 DIAGNOSIS — O24415 Gestational diabetes mellitus in pregnancy, controlled by oral hypoglycemic drugs: Secondary | ICD-10-CM

## 2017-03-20 DIAGNOSIS — O0993 Supervision of high risk pregnancy, unspecified, third trimester: Secondary | ICD-10-CM

## 2017-03-20 NOTE — Progress Notes (Signed)
Spanish interpreter Earnest Bailey present for visit

## 2017-03-20 NOTE — Progress Notes (Signed)
Subjective:  Cassandra Turner is a 31 y.o. G3P2002 at [redacted]w[redacted]d being seen today for ongoing prenatal care.  She is currently monitored for the following issues for this high-risk pregnancy and has Pregnancy, supervision, high-risk and Gestational diabetes mellitus (GDM) affecting pregnancy, antepartum on her problem list.  GDM: Patient taking glyburide 2.5mg  qAM.  Reports no hypoglycemic episodes.  Tolerating medication well Fasting: 80-98 (most elevated) 2hr PP: controlled  Patient reports no complaints.  Contractions: Not present. Vag. Bleeding: None.  Movement: Present. Denies leaking of fluid.   The following portions of the patient's history were reviewed and updated as appropriate: allergies, current medications, past family history, past medical history, past social history, past surgical history and problem list. Problem list updated.  Objective:   Vitals:   03/20/17 1404  BP: 102/68  Pulse: 79  Weight: 150 lb 14.4 oz (68.4 kg)    Fetal Status: Fetal Heart Rate (bpm): 160   Movement: Present     General:  Alert, oriented and cooperative. Patient is in no acute distress.  Skin: Skin is warm and dry. No rash noted.   Cardiovascular: Normal heart rate noted  Respiratory: Normal respiratory effort, no problems with respiration noted  Abdomen: Soft, gravid, appropriate for gestational age. Pain/Pressure: Absent     Pelvic: Vag. Bleeding: None     Cervical exam deferred        Extremities: Normal range of motion.  Edema: None  Mental Status: Normal mood and affect. Normal behavior. Normal judgment and thought content.   Urinalysis:      Assessment and Plan:  Pregnancy: G3P2002 at [redacted]w[redacted]d  1. Supervision of high risk pregnancy in third trimester FHT and FH normal  2. Gestational diabetes mellitus (GDM) in third trimester controlled on oral hypoglycemic drug Increase glyburide to 2.5mg  BID. Start twice weekly testing at 32 weeks.  Preterm labor symptoms and general  obstetric precautions including but not limited to vaginal bleeding, contractions, leaking of fluid and fetal movement were reviewed in detail with the patient. Please refer to After Visit Summary for other counseling recommendations.  No Follow-up on file.   Truett Mainland, DO

## 2017-03-30 ENCOUNTER — Ambulatory Visit (INDEPENDENT_AMBULATORY_CARE_PROVIDER_SITE_OTHER): Payer: Self-pay | Admitting: Obstetrics and Gynecology

## 2017-03-30 ENCOUNTER — Ambulatory Visit: Payer: Self-pay

## 2017-03-30 DIAGNOSIS — O24415 Gestational diabetes mellitus in pregnancy, controlled by oral hypoglycemic drugs: Secondary | ICD-10-CM

## 2017-03-30 NOTE — Progress Notes (Signed)
Pt informed that the ultrasound is considered a limited OB ultrasound and is not intended to be a complete ultrasound exam.  Patient also informed that the ultrasound is not being completed with the intent of assessing for fetal or placental anomalies or any pelvic abnormalities.  Explained that the purpose of today's ultrasound is to assess for presentation and amniotic fluid volume.  Patient acknowledges the purpose of the exam and the limitations of the study.    

## 2017-03-31 NOTE — Progress Notes (Signed)
NST Note Date: 03/30/2017 Gestational Age: 34/0 weeks FHT: 135 baseline, positive accelerations, negative deceleration, moderate variability Toco: one UC Time: 25 minutes  A/P: rNST, AFI, vertex. Continue current plan of care  Durene Romans MD Attending Center for [facility])

## 2017-04-03 ENCOUNTER — Ambulatory Visit (INDEPENDENT_AMBULATORY_CARE_PROVIDER_SITE_OTHER): Payer: Self-pay | Admitting: Obstetrics and Gynecology

## 2017-04-03 VITALS — BP 110/67 | HR 76 | Wt 150.1 lb

## 2017-04-03 DIAGNOSIS — O0993 Supervision of high risk pregnancy, unspecified, third trimester: Secondary | ICD-10-CM

## 2017-04-03 DIAGNOSIS — O0992 Supervision of high risk pregnancy, unspecified, second trimester: Secondary | ICD-10-CM

## 2017-04-03 DIAGNOSIS — O24415 Gestational diabetes mellitus in pregnancy, controlled by oral hypoglycemic drugs: Secondary | ICD-10-CM

## 2017-04-03 DIAGNOSIS — O24419 Gestational diabetes mellitus in pregnancy, unspecified control: Secondary | ICD-10-CM

## 2017-04-03 DIAGNOSIS — Z603 Acculturation difficulty: Secondary | ICD-10-CM

## 2017-04-03 DIAGNOSIS — Z789 Other specified health status: Secondary | ICD-10-CM

## 2017-04-03 LAB — POCT URINALYSIS DIP (DEVICE)
Bilirubin Urine: NEGATIVE
Glucose, UA: NEGATIVE mg/dL
HGB URINE DIPSTICK: NEGATIVE
Ketones, ur: NEGATIVE mg/dL
Leukocytes, UA: NEGATIVE
NITRITE: NEGATIVE
Protein, ur: NEGATIVE mg/dL
Specific Gravity, Urine: <=1.005 (ref 1.005–1.030)
UROBILINOGEN UA: 0.2 mg/dL (ref 0.0–1.0)
pH: 5.5 (ref 5.0–8.0)

## 2017-04-03 MED ORDER — GLYBURIDE 2.5 MG PO TABS: ORAL_TABLET | ORAL | 3 refills | Status: DC

## 2017-04-03 NOTE — Progress Notes (Signed)
Prenatal Visit Note Date: 04/03/2017 Clinic: [facility]  Subjective:  Cassandra Turner is a 31 y.o. G3P2002 at [redacted]w[redacted]d being seen today for ongoing prenatal care.  She is currently monitored for the following issues for this low-risk pregnancy and has Pregnancy, supervision, high-risk and Gestational diabetes mellitus (GDM) affecting pregnancy, antepartum on her problem list.  Patient reports no complaints.   Contractions: Not present. Vag. Bleeding: None.  Movement: Present. Denies leaking of fluid.   The following portions of the patient's history were reviewed and updated as appropriate: allergies, current medications, past family history, past medical history, past social history, past surgical history and problem list. Problem list updated.  Objective:   Vitals:   04/03/17 1310  BP: 110/67  Pulse: 76  Weight: 150 lb 1.6 oz (68.1 kg)    Fetal Status: Fetal Heart Rate (bpm): rNST   Movement: Present  Presentation: Vertex  General:  Alert, oriented and cooperative. Patient is in no acute distress.  Skin: Skin is warm and dry. No rash noted.   Cardiovascular: Normal heart rate noted  Respiratory: Normal respiratory effort, no problems with respiration noted  Abdomen: Soft, gravid, appropriate for gestational age. Pain/Pressure: Absent     Pelvic:  Cervical exam deferred        Extremities: Normal range of motion.  Edema: None  Mental Status: Normal mood and affect. Normal behavior. Normal judgment and thought content.   Urinalysis: Urine Protein: Negative Urine Glucose: Negative  Assessment and Plan:  Pregnancy: G3P2002 at [redacted]w[redacted]d  1. Gestational diabetes mellitus (GDM) in third trimester controlled on oral hypoglycemic drug Normal BS log with glyburide 2.5/2.5. rNST today. Continue with 2x/week testing (NST/AFI later this week) - Fetal nonstress test  2. Supervision of high risk pregnancy in third trimester Routine care. D/w pt re: BC nv.    Interpreter used.   Preterm labor symptoms and general obstetric precautions including but not limited to vaginal bleeding, contractions, leaking of fluid and fetal movement were reviewed in detail with the patient. Please refer to After Visit Summary for other counseling recommendations.     Aletha Halim, MD

## 2017-04-07 ENCOUNTER — Ambulatory Visit (INDEPENDENT_AMBULATORY_CARE_PROVIDER_SITE_OTHER): Payer: Self-pay | Admitting: Obstetrics & Gynecology

## 2017-04-07 ENCOUNTER — Ambulatory Visit: Payer: Self-pay

## 2017-04-07 VITALS — BP 105/66 | HR 70

## 2017-04-07 DIAGNOSIS — O24415 Gestational diabetes mellitus in pregnancy, controlled by oral hypoglycemic drugs: Secondary | ICD-10-CM

## 2017-04-10 ENCOUNTER — Ambulatory Visit (INDEPENDENT_AMBULATORY_CARE_PROVIDER_SITE_OTHER): Payer: Self-pay | Admitting: Obstetrics & Gynecology

## 2017-04-10 VITALS — BP 110/63 | HR 79

## 2017-04-10 DIAGNOSIS — O24415 Gestational diabetes mellitus in pregnancy, controlled by oral hypoglycemic drugs: Secondary | ICD-10-CM

## 2017-04-10 NOTE — Progress Notes (Signed)
NST 5/4 reactive

## 2017-04-14 ENCOUNTER — Ambulatory Visit: Payer: Self-pay

## 2017-04-14 ENCOUNTER — Ambulatory Visit (INDEPENDENT_AMBULATORY_CARE_PROVIDER_SITE_OTHER): Payer: Self-pay | Admitting: Obstetrics & Gynecology

## 2017-04-14 VITALS — BP 112/64 | HR 92

## 2017-04-14 DIAGNOSIS — O24415 Gestational diabetes mellitus in pregnancy, controlled by oral hypoglycemic drugs: Secondary | ICD-10-CM

## 2017-04-14 NOTE — Progress Notes (Signed)
Pt informed that the ultrasound is considered a limited OB ultrasound and is not intended to be a complete ultrasound exam.  Patient also informed that the ultrasound is not being completed with the intent of assessing for fetal or placental anomalies or any pelvic abnormalities.  Explained that the purpose of today's ultrasound is to assess for presentation and amniotic fluid volume.  Patient acknowledges the purpose of the exam and the limitations of the study.   [place] for growth scheduled 6/8 @ 0800.

## 2017-04-17 ENCOUNTER — Ambulatory Visit (INDEPENDENT_AMBULATORY_CARE_PROVIDER_SITE_OTHER): Payer: Self-pay | Admitting: Obstetrics and Gynecology

## 2017-04-17 VITALS — BP 89/59 | HR 75 | Wt 154.0 lb

## 2017-04-17 DIAGNOSIS — O24415 Gestational diabetes mellitus in pregnancy, controlled by oral hypoglycemic drugs: Secondary | ICD-10-CM

## 2017-04-18 NOTE — Progress Notes (Signed)
NST Note Date: 04/17/2016 Gestational Age: 33/3 FHT: 150 baseline, positive accelerations, negative deceleration, moderate variability Toco: quiet Time: 25 minutes  A/P: rNST. Continue current plan of care  Durene Romans MD Attending Center for [facility])

## 2017-04-21 ENCOUNTER — Ambulatory Visit: Payer: Self-pay

## 2017-04-21 ENCOUNTER — Ambulatory Visit (INDEPENDENT_AMBULATORY_CARE_PROVIDER_SITE_OTHER): Payer: Self-pay | Admitting: Obstetrics and Gynecology

## 2017-04-21 VITALS — BP 111/67 | HR 83

## 2017-04-21 DIAGNOSIS — O24415 Gestational diabetes mellitus in pregnancy, controlled by oral hypoglycemic drugs: Secondary | ICD-10-CM

## 2017-04-24 ENCOUNTER — Ambulatory Visit (INDEPENDENT_AMBULATORY_CARE_PROVIDER_SITE_OTHER): Payer: Self-pay | Admitting: Family Medicine

## 2017-04-24 VITALS — BP 115/70 | HR 79

## 2017-04-24 DIAGNOSIS — O24415 Gestational diabetes mellitus in pregnancy, controlled by oral hypoglycemic drugs: Secondary | ICD-10-CM

## 2017-04-24 NOTE — Progress Notes (Signed)
NST reactive.

## 2017-04-28 ENCOUNTER — Ambulatory Visit (INDEPENDENT_AMBULATORY_CARE_PROVIDER_SITE_OTHER): Payer: Self-pay | Admitting: Obstetrics and Gynecology

## 2017-04-28 ENCOUNTER — Ambulatory Visit: Payer: Self-pay

## 2017-04-28 VITALS — BP 121/68 | HR 81

## 2017-04-28 DIAGNOSIS — O24415 Gestational diabetes mellitus in pregnancy, controlled by oral hypoglycemic drugs: Secondary | ICD-10-CM

## 2017-04-28 NOTE — Progress Notes (Signed)
Reactive NST 

## 2017-04-28 NOTE — Progress Notes (Signed)
[place] for growth scheduled 6/8

## 2017-05-02 ENCOUNTER — Ambulatory Visit (INDEPENDENT_AMBULATORY_CARE_PROVIDER_SITE_OTHER): Payer: Self-pay | Admitting: Advanced Practice Midwife

## 2017-05-02 ENCOUNTER — Encounter: Payer: Self-pay | Admitting: Advanced Practice Midwife

## 2017-05-02 VITALS — BP 131/77 | HR 71 | Wt 159.5 lb

## 2017-05-02 DIAGNOSIS — O24419 Gestational diabetes mellitus in pregnancy, unspecified control: Secondary | ICD-10-CM

## 2017-05-02 DIAGNOSIS — O0993 Supervision of high risk pregnancy, unspecified, third trimester: Secondary | ICD-10-CM

## 2017-05-02 DIAGNOSIS — Z113 Encounter for screening for infections with a predominantly sexual mode of transmission: Secondary | ICD-10-CM

## 2017-05-02 DIAGNOSIS — O24415 Gestational diabetes mellitus in pregnancy, controlled by oral hypoglycemic drugs: Secondary | ICD-10-CM

## 2017-05-02 LAB — POCT URINALYSIS DIP (DEVICE)
BILIRUBIN URINE: NEGATIVE
GLUCOSE, UA: NEGATIVE mg/dL
Hgb urine dipstick: NEGATIVE
KETONES UR: NEGATIVE mg/dL
Nitrite: NEGATIVE
Protein, ur: NEGATIVE mg/dL
Urobilinogen, UA: 0.2 mg/dL (ref 0.0–1.0)
pH: 6 (ref 5.0–8.0)

## 2017-05-02 NOTE — Patient Instructions (Addendum)
AREA PEDIATRIC/FAMILY [place] [address], [place]  [address] Phone - [phone]   Fax - [phone]  [facility] [address] [place], [place] [address] Phone - [phone]   Fax - [place] [address] [place], [place]  [address] Phone - [phone]   Fax - [phone]  [place]. [address], [place] [address] [place], [place]  [address] Phone - [phone]   Fax - [phone]  [place] [address] [place], [place]  [address] Phone - [phone]   Fax - [phone]  [facility] [address] [place], [place]  [address] Phone - [phone]   Fax - [place] [address] [place], [place]  [address] Phone - [phone]   Fax - [phone]  [place] [address] [place], [place] [address] [place], [place]  [address] Phone - [phone]   Fax - [place] [address] [place], [place]  [address] Phone - [phone]   Fax - [phone] [facility] [place]. [address] [place], [place]  [address] Phone - [phone]   Fax - [phone]  [place] [address] [place], [place]  [address] Phone - [phone]   Fax - [phone]  [facility] [place], [place], [place]  [address] Phone - [phone]   Fax - [place] [address], [place], [place]  [address] Phone - [phone]   Fax - [phone]  [facility] [address] [place], [place]  [address] Phone - [place], [place]  [address] Phone - [phone]   Fax - [place] [address] [place], [place]  [address] Phone - [phone]   Fax - [phone]  [place] [address] [place], [place]  [address] Phone - [phone]   Fax - [place]. [place], [place]  [address] Phone - [phone]   Fax - [place], [place], [place]  [address] Phone - [phone]   Fax - [place] [address] [place], [place]  [address] Phone - [phone]   Fax - [phone]  [place]UB[place] [address] [place], [place]  [address] Phone - [phone]   Fax - [place] [address], [place], [place]  [address] Phone - [phone]   Fax - [phone]  [place] [address] [place], [place]  [address] Phone - [phone]   Fax - [phone] [place] [address] [place],   [address] Phone - [phone]   Fax - [place] [address] [place],   [address] Phone - [phone]   Fax - [place] [address] [place],   [address] Phone - [phone]   Fax - [phone]  [place] [address] [place] [address] Phone [phone]  Fax [phone]    Contracciones de [address] [facility] Contractions) Durante el [place], pueden presentarse contracciones  uterinas que no siempre indican que est en trabajo de [place]. QU SON LAS CONTRACCIONES DE BRAXTON HICKS? Las [employer] se presentan antes del [place] de [place] se conocen como contracciones de [place] o falso trabajo de [place]. Hacia el final del embarazo (32 a 34semanas), estas contracciones pueden aparecen con ms frecuencia y volverse ms intensas. No  corresponden al [place] de parto verdadero porque estas contracciones no producen el agrandamiento (la dilatacin) y el afinamiento del cuello del tero. Algunas veces, es difcil distinguirlas del trabajo de parto verdadero porque en algunos casos pueden ser [employer], y las personas tienen diferentes niveles de tolerancia al [employer]. No debe sentirse avergonzada si concurre al hospital con falso trabajo de [place]. En ocasiones, la nica forma de saber si el trabajo de parto es verdadero es que el mdico determine si hay cambios en el cuello del tero. Si no hay problemas prenatales u otras complicaciones de salud asociadas con el embarazo, no habr inconvenientes si la envan a su casa con falso trabajo de parto y espera que comience el verdadero. [place] DEL VERDADERO Falso trabajo de parto  Las contracciones del falso trabajo de parto duran menos y no son tan intensas como las verdaderas.  Generalmente son irregulares.  A menudo, se sienten en la parte delantera de la parte baja del abdomen y en la ingle,  y pueden desaparecer cuando camina o cambia de posicin mientras est acostada.  Las contracciones se vuelven ms dbiles y su duracin es Garment/textile technologist a medida que el tiempo transcurre.  Por lo general, no se hacen progresivamente ms intensas, regulares y Magazine features editor entre s como en el caso del [place] de parto verdadero. Antionette Fairy de parto  Las contracciones del verdadero trabajo de parto duran de 56 a 70segundos, son muy regulares y suelen volverse ms intensas, y [place] su frecuencia.  No desaparecen cuando camina.  La molestia generalmente se siente en la parte superior del tero y se extiende hacia la zona inferior del abdomen y [employer] cintura.  El mdico podr examinarla para determinar si el trabajo de parto es verdadero. El examen mostrar si el cuello del tero se est dilatando y [place]. LO QUE DEBE RECORDAR  [place] haciendo los  ejercicios habituales y siga otras indicaciones que el mdico le d.  Tome todos los medicamentos como le indic el mdico.  Consulting civil engineer a las visitas prenatales regulares.  Coma y beba con moderacin si cree que est en trabajo de parto.  Si las contracciones de [employer] provocan incomodidad:  [place] de posicin: si est acostada o descansando, camine; si est caminando, descanse.  Sintese y descanse en una baera con agua tibia.  Beba 2 o 3vasos de [place]. La deshidratacin puede provocar contracciones.  Respire lenta y profundamente varias veces por hora. Victor? Solicite atencin mdica de inmediato si:  Las contracciones se intensifican, se hacen ms regulares y Industrial/product designer s.  Tiene una prdida de lquido por la vagina.  Tiene fiebre.  Elimina mucosidad manchada con [place].  Tiene una hemorragia vaginal abundante.  Tiene dolor abdominal permanente.  Tiene un dolor en la zona lumbar que nunca tuvo antes.  Siente que la cabeza del beb empuja hacia abajo y ejerce presin en la zona plvica.  El beb no se mueve Administrator, Civil Service. Esta informacin no tiene Marine scientist el consejo del mdico. Asegrese de hacerle al mdico cualquier pregunta que tenga. Document Released:  08/31/2005 Document Revised: 03/14/2016 Document Reviewed: 09/02/2013 Elsevier Interactive Patient Education  2017 [employer].

## 2017-05-02 NOTE — Progress Notes (Signed)
   PRENATAL VISIT NOTE  Subjective:  Sinda Leedom is a 31 y.o. G3P2002 at [redacted]w[redacted]d being seen today for ongoing prenatal care.  She is currently monitored for the following issues for this high-risk pregnancy and has Pregnancy, supervision, high-risk; Gestational diabetes mellitus (GDM) affecting pregnancy, antepartum; and Language barrier on her problem list.  Patient reports swelling and pain of hands, R>L  Contractions: Irregular. Vag. Bleeding: None.  Movement: Present. Denies leaking of fluid.   The following portions of the patient's history were reviewed and updated as appropriate: allergies, current medications, past family history, past medical history, past social history, past surgical history and problem list. Problem list updated.  Objective:   Vitals:   05/02/17 1028  BP: 131/77  Pulse: 71  Weight: 159 lb 8 oz (72.3 kg)    Fetal Status: Fetal Heart Rate (bpm): NST reactive Fundal Height: 37 cm Movement: Present  Presentation: Vertex  General:  Alert, oriented and cooperative. Patient is in no acute distress.  Skin: Skin is warm and dry. No rash noted.   Cardiovascular: Normal heart rate noted  Respiratory: Normal respiratory effort, no problems with respiration noted  Abdomen: Soft, gravid, appropriate for gestational age. Pain/Pressure: Present     Pelvic:  Cervical exam performed Dilation: Closed Effacement (%): 0 Station: -3  Extremities: Normal range of motion.  Edema: Trace  Mental Status: Normal mood and affect. Normal behavior. Normal judgment and thought content.   All but one CBG in range.    Assessment and Plan:  Pregnancy: G3P2002 at [redacted]w[redacted]d  1. Gestational diabetes mellitus (GDM) in third trimester controlled on oral hypoglycemic drug  - Fetal nonstress test  2. Supervision of high risk pregnancy in third trimester  - GC/Chlamydia probe amp ([place])not at [facility] - Strep Gp B NAA  Term labor symptoms and general obstetric precautions  including but not limited to vaginal bleeding, contractions, leaking of fluid and fetal movement were reviewed in detail with the patient. Please refer to After Visit Summary for other counseling recommendations.  Continue twice weekly testing.  ROB 1 week.    Manya Silvas, CNM

## 2017-05-02 NOTE — Progress Notes (Signed)
Pt reports swelling of hands and feet.  [place] for growth scheduled on 6/8.

## 2017-05-03 LAB — GC/CHLAMYDIA PROBE AMP (~~LOC~~) NOT AT ARMC
Chlamydia: NEGATIVE
NEISSERIA GONORRHEA: NEGATIVE

## 2017-05-04 LAB — STREP GP B NAA: STREP GROUP B AG: NEGATIVE

## 2017-05-05 ENCOUNTER — Ambulatory Visit (INDEPENDENT_AMBULATORY_CARE_PROVIDER_SITE_OTHER): Payer: Self-pay | Admitting: Obstetrics & Gynecology

## 2017-05-05 ENCOUNTER — Ambulatory Visit: Payer: Self-pay

## 2017-05-05 VITALS — BP 117/72 | HR 73

## 2017-05-05 DIAGNOSIS — O24415 Gestational diabetes mellitus in pregnancy, controlled by oral hypoglycemic drugs: Secondary | ICD-10-CM

## 2017-05-05 NOTE — Progress Notes (Signed)
Pt informed that the ultrasound is considered a limited OB ultrasound and is not intended to be a complete ultrasound exam.  Patient also informed that the ultrasound is not being completed with the intent of assessing for fetal or placental anomalies or any pelvic abnormalities.  Explained that the purpose of today's ultrasound is to assess for presentation and amniotic fluid volume.  Patient acknowledges the purpose of the exam and the limitations of the study.   [place] for growth scheduled 6/8.

## 2017-05-08 ENCOUNTER — Ambulatory Visit (INDEPENDENT_AMBULATORY_CARE_PROVIDER_SITE_OTHER): Payer: Self-pay | Admitting: Family Medicine

## 2017-05-08 VITALS — BP 119/73 | HR 73 | Wt 160.6 lb

## 2017-05-08 DIAGNOSIS — O24415 Gestational diabetes mellitus in pregnancy, controlled by oral hypoglycemic drugs: Secondary | ICD-10-CM

## 2017-05-08 DIAGNOSIS — O24419 Gestational diabetes mellitus in pregnancy, unspecified control: Secondary | ICD-10-CM

## 2017-05-08 DIAGNOSIS — O0993 Supervision of high risk pregnancy, unspecified, third trimester: Secondary | ICD-10-CM

## 2017-05-08 LAB — POCT URINALYSIS DIP (DEVICE)
BILIRUBIN URINE: NEGATIVE
GLUCOSE, UA: NEGATIVE mg/dL
Hgb urine dipstick: NEGATIVE
KETONES UR: NEGATIVE mg/dL
LEUKOCYTES UA: NEGATIVE
Nitrite: NEGATIVE
PH: 5.5 (ref 5.0–8.0)
Protein, ur: NEGATIVE mg/dL
Specific Gravity, Urine: 1.01 (ref 1.005–1.030)
Urobilinogen, UA: 0.2 mg/dL (ref 0.0–1.0)

## 2017-05-08 NOTE — Progress Notes (Signed)
    PRENATAL VISIT NOTE  Subjective:  Cassandra Turner is a 31 y.o. G3P2002 at [redacted]w[redacted]d being seen today for ongoing prenatal care.  She is currently monitored for the following issues for this high-risk pregnancy and has Pregnancy, supervision, high-risk; Gestational diabetes mellitus (GDM) affecting pregnancy, antepartum; and Language barrier on her problem list.  Patient reports no complaints.  Contractions: Irregular. Vag. Bleeding: None.  Movement: Present. Denies leaking of fluid.   The following portions of the patient's history were reviewed and updated as appropriate: allergies, current medications, past family history, past medical history, past social history, past surgical history and problem list. Problem list updated.  Objective:   Vitals:   05/08/17 0847  BP: 119/73  Pulse: 73  Weight: 160 lb 9.6 oz (72.8 kg)    Fetal Status: Fetal Heart Rate (bpm): NST   Movement: Present     General:  Alert, oriented and cooperative. Patient is in no acute distress.  Skin: Skin is warm and dry. No rash noted.   Cardiovascular: Normal heart rate noted  Respiratory: Normal respiratory effort, no problems with respiration noted  Abdomen: Soft, gravid, appropriate for gestational age. Pain/Pressure: Present     Pelvic:  Cervical exam deferred        Extremities: Normal range of motion.  Edema: Trace  Mental Status: Normal mood and affect. Normal behavior. Normal judgment and thought content.  FBS 71-98 (1 is out of range) 2 hour pp 69-176 ( 7 of 21 out of range) NST:  Baseline: 135 bpm, Variability: Good {> 6 bpm), Accelerations: Reactive and Decelerations: Absent   Assessment and Plan:  Pregnancy: G3P2002 at [redacted]w[redacted]d  1. Gestational diabetes mellitus (GDM) in third trimester controlled on oral hypoglycemic drug Continue glyburide Fix diet issues U/s for growth on 6/8 IOL scheduled for 39 wks - Fetal nonstress test  2. Supervision of high risk pregnancy in third  trimester Continue prenatal care.    Preterm labor symptoms and general obstetric precautions including but not limited to vaginal bleeding, contractions, leaking of fluid and fetal movement were reviewed in detail with the patient. Please refer to After Visit Summary for other counseling recommendations.  Return in about 7 days (around 05/15/2017) for Ob fu and NST.   Donnamae Jude, MD

## 2017-05-08 NOTE — Progress Notes (Signed)
[place] for growth scheduled 6/8.  IOL scheduled 6/14 @

## 2017-05-08 NOTE — Patient Instructions (Signed)
Tercer trimestre de Media planner (Third Trimester of Pregnancy) El tercer trimestre comprende desde la [id number] hasta la [id number], es decir, desde el mes7 hasta el mes9. El tercer trimestre es un perodo en el que el feto crece rpidamente. Hacia el final del noveno mes, el feto mide alrededor de 20pulgadas (45cm) de largo y pesa entre 6 y 59 libras (2,700 y 24,500kg). CAMBIOS EN EL ORGANISMO Su organismo atraviesa por muchos cambios durante el [place], y estos varan de Ardelia Mems mujer a Theatre manager.  Seguir [employer] Insurance. Es de esperar que aumente entre 25 y 35libras (37 y 16kg) hacia el final del Media planner.  Podrn aparecer las primeras Apache Corporation caderas, el abdomen y las [place].  Puede tener necesidad de Garment/textile technologist con ms frecuencia porque el feto baja hacia la pelvis y ejerce presin sobre la vejiga.  Debido al Glennis Brink podr sentir Victorio Palm estomacal con frecuencia.  Puede estar estreida, ya que ciertas hormonas enlentecen los movimientos de los msculos que JPM[employer] & Co desechos a travs de los intestinos.  Pueden aparecer hemorroides o abultarse e hincharse las venas (venas varicosas).  Puede sentir dolor plvico debido al Medtronic y a que las hormonas del Scientist, research (life sciences) las articulaciones entre los huesos de la pelvis. El dolor de espalda puede ser consecuencia de la sobrecarga de los msculos que soportan la [place].  Tal vez haya cambios en el cabello que pueden incluir su engrosamiento, crecimiento rpido y cambios en la textura. Adems, a algunas mujeres se les cae el cabello durante o despus del embarazo, o tienen el cabello seco o fino. Lo ms probable es que el cabello se le normalice despus del nacimiento del beb.  Las Lincoln National Corporation seguirn creciendo y Teaching laboratory technician. A veces, puede haber una secrecin amarilla de las mamas llamada calostro.  El ombligo puede salir hacia afuera.  Puede sentir que le falta el aire debido a que se expande el tero.  Puede notar que el feto  "baja" o lo siente ms bajo, en el abdomen.  Puede tener una prdida de secrecin mucosa con sangre. Esto suele ocurrir en el trmino de unos pocos das a una semana antes de que comience el [place] de [place].  El cuello del tero se vuelve delgado y blando (se borra) cerca de la fecha de [place]. QU DEBE ESPERAR EN LOS EXMENES PRENATALES Le harn exmenes prenatales cada 2semanas hasta la semana36. A partir de ese momento le harn exmenes semanales. Durante una visita prenatal de rutina:  La pesarn para asegurarse de que usted y el feto estn creciendo normalmente.  Le tomarn la presin arterial.  Le medirn el abdomen para controlar el desarrollo del beb.  Se escucharn los latidos cardacos fetales.  Se evaluarn los resultados de los estudios solicitados en visitas anteriores.  Le revisarn el cuello del tero cuando est prxima la fecha de parto para controlar si este se ha borrado. Alrededor de la semana36, el mdico le revisar el cuello del tero. Al mismo tiempo, realizar un anlisis de las secreciones del tejido vaginal. Este examen es para determinar si hay un tipo de bacteria, estreptococo Grupo B. El mdico le explicar esto con ms detalle. El mdico puede preguntarle lo siguiente:  Cmo le gustara que fuera el [place].  Cmo se siente.  Si siente los movimientos del beb.  Si ha tenido sntomas anormales, como prdida de lquido, [place], dolores de cabeza intensos o clicos abdominales.  Si est consumiendo algn producto que contenga tabaco, como cigarrillos, tabaco de Higher education careers adviser y  cigarrillos electrnicos.  Si tiene alguna pregunta. Otros exmenes o estudios de deteccin que pueden realizarse durante el tercer trimestre incluyen lo siguiente:  Anlisis de sangre para controlar los niveles de hierro (anemia).  Controles fetales para determinar su salud, nivel de actividad y crecimiento. Si tiene alguna enfermedad o hay problemas durante el embarazo, le harn  estudios.  Prueba del VIH (virus de inmunodeficiencia humana). Si corre un riesgo alto, pueden realizarle una prueba de deteccin del VIH durante el tercer trimestre del embarazo. FALSO TRABAJO DE PARTO Es posible que sienta contracciones leves e irregulares que finalmente desaparecen. Se llaman contracciones de Braxton Hicks o falso trabajo de parto. Las contracciones pueden durar horas, das o incluso semanas, antes de que el verdadero trabajo de parto se inicie. Si las contracciones ocurren a intervalos regulares, se intensifican o se hacen dolorosas, lo mejor es que la revise el mdico. SIGNOS DE TRABAJO DE PARTO  Clicos de tipo menstrual.  Contracciones cada 5minutos o menos.  Contracciones que comienzan en la parte superior del tero y se extienden hacia abajo, a la zona inferior del abdomen y la espalda.  Sensacin de mayor presin en la pelvis o dolor de espalda.  Una secrecin de mucosidad acuosa o con sangre que sale de la vagina. Si tiene alguno de estos signos antes de la semana37 del embarazo, llame a su mdico de inmediato. Debe concurrir al hospital para que la controlen inmediatamente. INSTRUCCIONES PARA EL CUIDADO EN EL HOGAR  Evite fumar, consumir hierbas, beber alcohol y tomar frmacos que no le hayan recetado. Estas sustancias qumicas afectan la formacin y el desarrollo del beb.  No consuma ningn producto que contenga tabaco, lo que incluye cigarrillos, tabaco de mascar y cigarrillos electrnicos. Si necesita ayuda para dejar de fumar, consulte al mdico. Puede recibir asesoramiento y otro tipo de recursos para dejar de fumar.  Siga las indicaciones del mdico en relacin con el uso de medicamentos. Durante el embarazo, hay medicamentos que son seguros de tomar y otros que no.  Haga ejercicio solamente como se lo haya indicado el mdico. Sentir clicos uterinos es un buen signo para detener la actividad fsica.  Contine comiendo alimentos sanos con  regularidad.  Use un sostn que le brinde buen soporte si le duelen las mamas.  No se d baos de inmersin en agua caliente, baos turcos ni saunas.  Use el cinturn de seguridad en todo momento mientras conduce.  No coma carne cruda ni queso sin cocinar; evite el contacto con las bandejas sanitarias de los gatos y la tierra que estos animales usan. Estos elementos contienen grmenes que pueden causar defectos congnitos en el beb.  Tome las vitaminas prenatales.  Tome entre 1500 y 2000mg de calcio diariamente comenzando en la semana20 del embarazo hasta el parto.  Si est estreida, pruebe un laxante suave (si el mdico lo autoriza). Consuma ms alimentos ricos en fibra, como vegetales y frutas frescos y cereales integrales. Beba gran cantidad de lquido para mantener la orina de tono claro o color amarillo plido.  Dese baos de asiento con agua tibia para aliviar el dolor o las molestias causadas por las hemorroides. Use una crema para las hemorroides si el mdico la autoriza.  Si tiene venas varicosas, use medias de descanso. Eleve los pies durante 15minutos, 3 o 4veces por da. Limite el consumo de sal en su dieta.  Evite levantar objetos pesados, use zapatos de tacones bajos y mantenga una buena postura.  Descanse con las piernas elevadas si tiene   calambres o dolor de cintura.  Visite a su dentista si no lo ha [place]ar[place] manager. Use un cepillo de dientes blando para higienizarse los dientes y psese el hilo dental con suavidad.  Puede seguir [employer], a menos que el mdico le indique lo contrario.  No haga viajes largos excepto que sea absolutamente necesario y solo con la autorizacin del [place] clases prenatales para Development worker, international aid, Psychologist, prison and probation services y hacer preguntas sobre el [place] de parto y [place].  Haga un ensayo de la partida al hospital.  Prepare el bolso que llevar al hospital.  Prepare la habitacin del beb.  Concurra a todas  las visitas prenatales segn las indicaciones de su mdico.  SOLICITE ATENCIN MDICA SI:  No est segura de que est en trabajo de parto o de que ha roto la bolsa de las aguas.  Tiene mareos.  Siente clicos leves, presin en la pelvis o dolor persistente en el abdomen.  Tiene nuseas, vmitos o diarrea persistentes.  Margette Fast secrecin vaginal con mal olor.  Siente dolor al [employer] Airlines.  SOLICITE ATENCIN MDICA DE INMEDIATO SI:  Tiene fiebre.  Tiene una prdida de lquido por la vagina.  Tiene sangrado o pequeas prdidas vaginales.  Siente dolor intenso o clicos en el abdomen.  Sube o baja de peso rpidamente.  Tiene dificultad para respirar y siente dolor de pecho.  Sbitamente se le hinchan mucho el rostro, las [place], los tobillos, los pies o las piernas.  No ha sentido los movimientos del beb durante Leone Brand.  Siente un dolor de cabeza intenso que no se alivia con medicamentos.  Su visin se modifica.  Esta informacin no tiene Marine scientist el consejo del mdico. Asegrese de hacerle al mdico cualquier pregunta que tenga. Document Released: 08/31/2005 Document Revised: 12/12/2014 Document Reviewed: 01/22/2013 Elsevier Interactive Patient Education  2017 [place] (Breastfeeding) Decidir Economist es una de las mejores elecciones que puede hacer por usted y su beb. El cambio hormonal durante el Media planner produce el desarrollo del tejido mamario y [place] la cantidad y el tamao de los conductos galactforos. Estas hormonas tambin permiten que las protenas, los azcares y las grasas de la sangre produzcan la Northeast Utilities materna en las glndulas productoras de [place]. Las hormonas impiden que la leche materna sea liberada antes del nacimiento del beb, adems de impulsar el flujo de leche luego del nacimiento. Una vez que ha comenzado a Economist, Freight forwarder beb, as Therapist, occupational succin o Social research officer, government, pueden estimular la liberacin de [place]  de las glndulas productoras de [place]. LOS BENEFICIOS DE AMAMANTAR Para el beb  La primera leche (calostro) ayuda a Garment/textile technologist funcionamiento del sistema digestivo del beb.  La leche tiene anticuerpos que ayudan a Chemical engineer las infecciones en el beb.  El beb tiene una menor incidencia de asma, alergias y del sndrome de muerte sbita del lactante.  Los nutrientes en la [place] materna son mejores para el beb que la [place] maternizada y estn preparados exclusivamente para cubrir las necesidades del beb.  La leche materna mejora el desarrollo cerebral del beb.  Es menos probable que el beb desarrolle otras enfermedades, como obesidad infantil, asma o diabetes mellitus de tipo 2. Para usted  La lactancia materna favorece el desarrollo de un vnculo muy especial entre la madre y el beb.  Es conveniente. La leche materna siempre est disponible a la Tree surgeon y es [place].  La lactancia materna ayuda a quemar caloras y  a perder el peso ganado [place].  Favorece la contraccin del tero al tamao que tena antes del embarazo de manera ms rpida y disminuye el sangrado (loquios) despus del parto.  La lactancia materna contribuye a reducir Catering manager de desarrollar diabetes mellitus de tipo 2, osteoporosis o cncer de mama o de ovario en el futuro. SIGNOS DE QUE EL BEB EST HAMBRIENTO Primeros signos de hambre  Aumenta su estado de [place].  Se estira.  Mueve la cabeza de un lado a otro.  Mueve la cabeza y abre la boca cuando se le toca la mejilla o la comisura de la boca (reflejo de bsqueda).  [place] vocalizaciones, tales como sonidos de succin, se relame los labios, emite arrullos, suspiros, o chirridos.  Mueve la [employer] boca.  Se chupa con ganas los dedos o las manos. Signos tardos de [employer] Financial.  Llora de manera intermitente. Signos de [employer] Wholes[employer] signos de hambre extrema requerirn que lo calme y  lo consuele antes de que el beb pueda alimentarse adecuadamente. No espere a que se manifiesten los siguientes signos de hambre extrema para comenzar a Economist:  Air cabin crew.  Llanto intenso y fuerte.  Gritos. INFORMACIN BSICA SOBRE LA LACTANCIA MATERNA Iniciacin de la lactancia materna  Encuentre un lugar cmodo para sentarse o acostarse, con un buen respaldo para el cuello y la espalda.  Coloque una almohada o una manta enrollada debajo del beb para acomodarlo a la altura de la mama (si est sentada). Las almohadas para Economist se han diseado especialmente a fin de servir de apoyo para los brazos y el beb Kellogg.  Asegrese de que el abdomen del beb est frente al suyo.  Masajee suavemente la mama. Con las yemas de los dedos, masajee la pared del pecho hacia el pezn en un movimiento circular. Esto estimula el flujo de [place]. Es posible que Oceanographer este movimiento mientras amamanta si la leche fluye lentamente.  Sostenga la mama con el pulgar por arriba del pezn y los otros 4 dedos por debajo de la mama. Asegrese de que los dedos se encuentren lejos del pezn y de la boca del beb.  Empuje suavemente los labios del beb con el pezn o con el dedo.  Cuando la boca del beb se abra lo suficiente, acrquelo rpidamente a la mama e introduzca todo el pezn y la zona oscura que lo rodea (areola), tanto como sea posible, dentro de la boca del beb. ? Debe haber ms areola visible por arriba del labio superior del beb que por debajo del labio inferior. ? La lengua del beb debe estar entre la enca inferior y la [place].  Asegrese de que la boca del beb est en la posicin correcta alrededor del pezn (prendida). Los labios del beb deben crear un sello sobre la mama y estar doblados hacia afuera (invertidos).  Es comn que el beb succione durante 2 a 3 minutos para que comience el flujo de [place]. Cmo debe prenderse Es muy importante que le ensee al  beb cmo prenderse adecuadamente a la mama. Si el beb no se prende adecuadamente, puede causarle dolor en el pezn y reducir la produccin de [place], y hacer que el beb tenga un escaso aumento de [place]. Adems, si el beb no se prende adecuadamente al pezn, puede tragar aire durante la alimentacin. Esto puede causarle molestias al beb. Hacer eructar al beb al Eliezer Lofts de mama puede ayudarlo a liberar el  aire. Sin embargo, ensearle al beb cmo prenderse a la mama adecuadamente es la mejor manera de evitar que se sienta molesto por tragar Administrator, sports se alimenta. Signos de que el beb se ha prendido adecuadamente al pezn:  Hall Busing o succiona de modo silencioso, sin causarle dolor.  Se escucha que traga cada 3 o 4 succiones.  Hay movimientos musculares por arriba y por delante de sus odos al Mining engineer. Signos de que el beb no se ha prendido Product manager al pezn:  Hace ruidos de succin o de chasquido mientras se alimenta.  Siente dolor en el pezn. Si cree que el beb no se prendi correctamente, deslice el dedo en la comisura de la boca y Micron Technology las encas del beb para interrumpir la succin. Intente comenzar a amamantar nuevamente. Signos de Economist Signos del beb:  Disminuye gradualmente el nmero de succiones o cesa la succin por completo.  Se duerme.  Relaja el cuerpo.  Retiene una pequea cantidad de [employer]EL Corporation boca.  Se desprende solo del pecho. Signos que presenta usted:  Las mamas han aumentado la firmeza, el peso y el tamao 1 a 3 horas despus de Economist.  Estn ms blandas inmediatamente despus de amamantar.  Un aumento del volumen de [place], y tambin un cambio en su consistencia y color se producen hacia el [place] de Therapist, nutritional.  Los pezones no duelen, ni estn agrietados ni sangran. Signos de que su beb recibe la cantidad de leche suficiente  Mojar por lo menos 1 o 2 paales durante las primeras 24  horas despus del nacimiento.  Mojar por lo menos 5 o 6 paales cada 24 horas durante la primera semana despus del nacimiento. La orina debe ser transparente o de color amarillo plido a los 5 das despus del nacimiento.  Mojar entre 6 y 5 paales cada 83 horas a medida que el beb sigue creciendo y desarrollndose.  Defeca al menos 3 veces en 24 horas a los 5 das de vida. La materia fecal debe ser blanda y [place].  Defeca al menos 3 veces en 24 horas a los 7 das de vida. La materia fecal debe ser grumosa y [place].  No registra una prdida de peso mayor del 10% del peso al nacer durante los primeros [place].  Aumenta de peso un promedio de 4 a 7onzas (113 a 198g) por semana despus de los [place].  Aumenta de [place], [place], de [place] uniforme a Proofreader de los 5 das de vida, sin Museum/gallery curator prdida de peso despus de las 2semanas de vida. Despus de alimentarse, es posible que el beb regurgite una pequea cantidad. Esto es frecuente. FRECUENCIA Y DURACIN DE LA LACTANCIA MATERNA El amamantamiento frecuente la ayudar a producir ms [place] y a Warden/ranger de Social research officer, government en los pezones e hinchazn en las [place]. Alimente al beb cuando muestre signos de hambre o si siente la necesidad de reducir la congestin de las [place]. Esto se denomina "lactancia a demanda". Evite el uso del chupete mientras trabaja para establecer la lactancia (las primeras 4 a 6 semanas despus del nacimiento del beb). Despus de este perodo, podr ofrecerle un chupete. Las investigaciones demostraron que el uso del chupete durante el primer ao de vida del beb disminuye el riesgo de desarrollar el sndrome de muerte sbita del lactante (SMSL). Permita que el nio se alimente en cada mama todo lo que desee. Contine amamantando al beb hasta que haya terminado de alimentarse. Cuando  el beb se desprende o se queda dormido mientras se est alimentando de la primera mama, ofrzcale la segunda.  Debido a que, con frecuencia, los recin [place]akes las primeras semanas de vida, es posible que deba despertar al beb para alimentarlo. Los horarios de Writer de un beb a otro. Sin embargo, las siguientes reglas pueden servir como gua para ayudarla a Engineer, materials que el beb se alimenta adecuadamente:  Se puede amamantar a los recin nacidos (bebs de 4 semanas o menos de vida) cada 1 a 3 horas.  No deben transcurrir ms de 3 horas durante el da o 5 horas durante la noche sin que se amamante a los recin nacidos.  Debe amamantar al beb 8 veces como mnimo en un perodo de 24 horas, hasta que comience a introducir slidos en su dieta, a los 6 meses de vida aproximadamente. [place] extraccin y Recruitment consultant de la leche materna le permiten asegurarse de que el beb se alimente exclusivamente de [place], aun en momentos en los que no puede amamantar. Esto tiene especial importancia si debe regresar al [place] en el perodo en que an est amamantando o si no puede estar presente en los momentos en que el beb debe alimentarse. Su asesor en lactancia puede orientarla sobre cunto tiempo es [place]. El sacaleche es un aparato que le permite extraer leche de la mama a un recipiente estril. Luego, la leche materna extrada puede almacenarse en un refrigerador o Pension scheme manager. Algunos sacaleches son Theodore Demark, James Ivanoff otros son elctricos. Consulte a su asesor en lactancia qu tipo ser ms conveniente para usted. Los sacaleches se pueden comprar; sin embargo, algunos hospitales y grupos de apoyo a la lactancia materna alquilan Production assistant, radio. Un asesor en lactancia puede ensearle cmo extraer [employer]Max [employer]orporated, en caso de que prefiera no usar un sacaleche. CMO CUIDAR LAS MAMAS DURANTE LA LACTANCIA MATERNA Los pezones se secan, agrietan y duelen durante la Therapist, nutritional. Las siguientes  recomendaciones pueden ayudarla a Theatre manager las [employer] y sanas:  Art therapist usar jabn en los pezones.  Use un sostn de soporte. Aunque no son esenciales, las camisetas sin mangas o los sostenes especiales para Economist estn diseados para acceder fcilmente a las mamas, para Economist sin tener que quitarse todo el sostn o la camiseta. Evite usar sostenes con aro o sostenes muy ajustados.  Seque al aire sus pezones durante 3 a 62minutos despus de amamantar al beb.  Utilice solo apsitos de Chiropodist sostn para Tax adviser las prdidas de [place]. La prdida de un poco de Owens [employer]ning tomas es normal.  Utilice lanolina sobre los pezones luego de Economist. La lanolina ayuda a mantener la humedad normal de la piel. Si [place] lanolina pura, no tiene que lavarse los pezones antes de volver a Research scientist (life sciences) al beb. La lanolina pura no es txica para el beb. Adems, puede extraer Cisco algunas gotas de [place] materna y Community education officer suavemente esa [employer] Resources, para que la [place] se seque al aire. Durante las primeras semanas despus de dar a luz, algunas mujeres pueden experimentar hinchazn en las mamas (congestin Long). La congestin puede hacer que sienta las mamas pesadas, calientes y sensibles al tacto. El pico de la congestin ocurre dentro de los 3 a 5 das despus del [place]. Las siguientes recomendaciones pueden ayudarla a Public house manager la congestin:  Vace por completo las mamas al [place]. Puede aplicar calor hmedo en las mamas (  en la ducha o con toallas hmedas para manos) antes de Economist o extraer Northeast Utilities. Esto aumenta la circulacin y [place] a que la [place]. Si el beb no vaca por completo las mamas cuando lo amamanta, extraiga la [place] restante despus de que haya finalizado.  Use un sostn ajustado (para amamantar o comn) o una camiseta sin mangas durante 1 o 2 das para indicar al cuerpo que disminuya ligeramente la produccin de  [place].  Aplique compresas de hielo E[employer], a menos que le resulte demasiado incmodo.  Asegrese de que el beb est prendido y se encuentre en la posicin correcta mientras lo alimenta. Si la congestin persiste luego de 48 horas o despus de seguir estas recomendaciones, comunquese con su mdico o un Lobbyist. RECOMENDACIONES GENERALES PARA EL CUIDADO DE LA SALUD DURANTE LA LACTANCIA MATERNA  Consuma alimentos saludables. Alterne comidas y colaciones, y coma 3 de cada una por da. Dado que lo que come Solectron Corporation, es posible que algunas comidas hagan que su beb se vuelva ms irritable de lo habitual. Evite comer este tipo de alimentos si percibe que afectan de manera negativa al beb.  Beba leche, jugos de fruta y agua para Engineer, water su sed (aproximadamente [place]).  Descanse con frecuencia, reljese y tome sus vitaminas prenatales para evitar la fatiga, el estrs y la anemia.  Contine con los autocontroles de la mama.  Evite Engineer, manufacturing systems y fumar tabaco. Las sustancias qumicas de los cigarrillos que pasan a la leche materna y la exposicin al humo ambiental del tabaco pueden daar al beb.  No consuma alcohol ni drogas, incluida la marihuana. Algunos medicamentos, que pueden ser perjudiciales para el beb, pueden pasar a travs de la SLM Corporation. Es importante que consulte a su mdico antes de Medical sales representative, incluidos todos los medicamentos recetados y de [place], as como los suplementos vitamnicos y herbales. Puede quedar embarazada durante la lactancia. Si desea controlar la natalidad, consulte a su mdico cules son las opciones ms seguras para el beb. SOLICITE ATENCIN MDICA SI:  Usted siente que quiere dejar de Economist o se siente frustrada con la lactancia.  Siente dolor en las mamas o en los pezones.  Sus pezones estn agrietados o Control and instrumentation engineer.  Sus pechos estn irritados, sensibles o calientes.  Tiene un rea  hinchada en cualquiera de las mamas.  Siente escalofros o fiebre.  Tiene nuseas o vmitos.  Presenta una secrecin de otro lquido distinto de la leche materna de los pezones.  Sus mamas no se llenan antes de Economist al beb para el quinto da despus del [place].  Se siente triste y deprimida.  El beb est demasiado somnoliento como para comer bien.  El beb tiene problemas para dormir.  Moja menos de 3 paales en 24 horas.  Defeca menos de 3 veces en 24 horas.  La piel del beb o la parte blanca de los ojos se vuelven amarillentas.  El beb no ha aumentado de [place] a los [place].  SOLICITE ATENCIN MDICA DE INMEDIATO SI:  El beb est muy cansado Engineer, manufacturing) y no se quiere despertar para comer.  Le sube la fiebre sin causa.  Esta informacin no tiene Marine scientist el consejo del mdico. Asegrese de hacerle al mdico cualquier pregunta que tenga. Document Released: 11/21/2005 Document Revised: 03/14/2016 Document Reviewed: 05/15/2013 Elsevier Interactive Patient Education  2017 [employer].

## 2017-05-09 ENCOUNTER — Telehealth (HOSPITAL_COMMUNITY): Payer: Self-pay | Admitting: *Deleted

## 2017-05-09 NOTE — Telephone Encounter (Signed)
Preadmission screen Interpreter number [phone]

## 2017-05-12 ENCOUNTER — Encounter (HOSPITAL_COMMUNITY): Payer: Self-pay

## 2017-05-12 ENCOUNTER — Ambulatory Visit (INDEPENDENT_AMBULATORY_CARE_PROVIDER_SITE_OTHER): Payer: Self-pay | Admitting: Obstetrics & Gynecology

## 2017-05-12 ENCOUNTER — Ambulatory Visit (HOSPITAL_COMMUNITY)
Admission: RE | Admit: 2017-05-12 | Discharge: 2017-05-12 | Disposition: A | Discharge status: Home / Self Care | Payer: Self-pay | Source: Ambulatory Visit | Attending: Obstetrics & Gynecology | Admitting: Obstetrics & Gynecology

## 2017-05-12 ENCOUNTER — Other Ambulatory Visit: Payer: Self-pay | Admitting: Obstetrics & Gynecology

## 2017-05-12 VITALS — BP 129/84 | HR 64

## 2017-05-12 DIAGNOSIS — Z3A38 38 weeks gestation of pregnancy: Secondary | ICD-10-CM

## 2017-05-12 DIAGNOSIS — O24415 Gestational diabetes mellitus in pregnancy, controlled by oral hypoglycemic drugs: Secondary | ICD-10-CM

## 2017-05-12 NOTE — Progress Notes (Signed)
US for growth done today 

## 2017-05-13 ENCOUNTER — Inpatient Hospital Stay (HOSPITAL_COMMUNITY)
Admission: AD | Admit: 2017-05-13 | Discharge: 2017-05-15 | DRG: 775 | Disposition: A | Discharge status: Home / Self Care | Payer: Medicaid Other | Source: Ambulatory Visit | Attending: Obstetrics and Gynecology | Admitting: Obstetrics and Gynecology

## 2017-05-13 ENCOUNTER — Inpatient Hospital Stay (HOSPITAL_COMMUNITY): Payer: Medicaid Other | Admitting: Anesthesiology

## 2017-05-13 ENCOUNTER — Encounter (HOSPITAL_COMMUNITY): Payer: Self-pay

## 2017-05-13 DIAGNOSIS — O0992 Supervision of high risk pregnancy, unspecified, second trimester: Secondary | ICD-10-CM

## 2017-05-13 DIAGNOSIS — O24425 Gestational diabetes mellitus in childbirth, controlled by oral hypoglycemic drugs: Secondary | ICD-10-CM | POA: Diagnosis present

## 2017-05-13 DIAGNOSIS — Z3A39 39 weeks gestation of pregnancy: Secondary | ICD-10-CM | POA: Diagnosis not present

## 2017-05-13 DIAGNOSIS — Z3A38 38 weeks gestation of pregnancy: Secondary | ICD-10-CM

## 2017-05-13 DIAGNOSIS — O24419 Gestational diabetes mellitus in pregnancy, unspecified control: Secondary | ICD-10-CM

## 2017-05-13 DIAGNOSIS — Z7982 Long term (current) use of aspirin: Secondary | ICD-10-CM | POA: Diagnosis not present

## 2017-05-13 DIAGNOSIS — O24429 Gestational diabetes mellitus in childbirth, unspecified control: Secondary | ICD-10-CM | POA: Diagnosis not present

## 2017-05-13 DIAGNOSIS — Z3493 Encounter for supervision of normal pregnancy, unspecified, third trimester: Secondary | ICD-10-CM | POA: Diagnosis present

## 2017-05-13 LAB — GLUCOSE, CAPILLARY
GLUCOSE-CAPILLARY: 69 mg/dL (ref 65–99)
GLUCOSE-CAPILLARY: 141 mg/dL — AB (ref 65–99)
GLUCOSE-CAPILLARY: 106 mg/dL — AB (ref 65–99)
GLUCOSE-CAPILLARY: 157 mg/dL — AB (ref 65–99)
GLUCOSE-CAPILLARY: 75 mg/dL (ref 65–99)
Glucose-Capillary: 47 mg/dL — ABNORMAL LOW (ref 65–99)
Glucose-Capillary: 65 mg/dL (ref 65–99)
Glucose-Capillary: 55 mg/dL — ABNORMAL LOW (ref 65–99)
Glucose-Capillary: 84 mg/dL (ref 65–99)
Glucose-Capillary: 93 mg/dL (ref 65–99)

## 2017-05-13 LAB — CBC
HCT: 37.3 % (ref 36.0–46.0)
Hemoglobin: 12.8 g/dL (ref 12.0–15.0)
MCH: 32.4 pg (ref 26.0–34.0)
MCHC: 34.3 g/dL (ref 30.0–36.0)
MCV: 94.4 fL (ref 78.0–100.0)
PLATELETS: 193 10*3/uL (ref 150–400)
RBC: 3.95 MIL/uL (ref 3.87–5.11)
RDW: 14.8 % (ref 11.5–15.5)
WBC: 9 10*3/uL (ref 4.0–10.5)

## 2017-05-13 LAB — TYPE AND SCREEN
ABO/RH(D): O POS
ANTIBODY SCREEN: NEGATIVE

## 2017-05-13 LAB — ABO/RH: ABO/RH(D): O POS

## 2017-05-13 LAB — POCT FERN TEST: POCT FERN TEST: POSITIVE

## 2017-05-13 MED ORDER — PHENYLEPHRINE 40 MCG/ML (10ML) SYRINGE FOR IV PUSH (FOR BLOOD PRESSURE SUPPORT)
80.0000 ug | PREFILLED_SYRINGE | INTRAVENOUS | Status: DC | PRN
  Filled 2017-05-13: qty 5

## 2017-05-13 MED ORDER — LIDOCAINE HCL (PF) 1 % IJ SOLN
INTRAMUSCULAR | Status: DC | PRN
  Administered 2017-05-13 (×2): 4 mL

## 2017-05-13 MED ORDER — DEXTROSE 50 % IV SOLN
INTRAVENOUS | Status: AC
  Filled 2017-05-13: qty 50

## 2017-05-13 MED ORDER — PHENYLEPHRINE 40 MCG/ML (10ML) SYRINGE FOR IV PUSH (FOR BLOOD PRESSURE SUPPORT)
PREFILLED_SYRINGE | INTRAVENOUS | Status: AC
  Filled 2017-05-13: qty 10

## 2017-05-13 MED ORDER — LIDOCAINE HCL (PF) 1 % IJ SOLN
30.0000 mL | INTRAMUSCULAR | Status: DC | PRN
  Filled 2017-05-13: qty 30

## 2017-05-13 MED ORDER — DEXTROSE IN LACTATED RINGERS 5 % IV SOLN
INTRAVENOUS | Status: DC
  Administered 2017-05-13: 20:00:00 via INTRAVENOUS

## 2017-05-13 MED ORDER — OXYTOCIN 40 UNITS IN LACTATED RINGERS INFUSION - SIMPLE MED
2.5000 [IU]/h | INTRAVENOUS | Status: DC
  Filled 2017-05-13: qty 1000

## 2017-05-13 MED ORDER — TERBUTALINE SULFATE 1 MG/ML IJ SOLN
0.2500 mg | Freq: Once | INTRAMUSCULAR | Status: DC | PRN
  Filled 2017-05-13: qty 1

## 2017-05-13 MED ORDER — OXYCODONE-ACETAMINOPHEN 5-325 MG PO TABS: 1.0000 | ORAL_TABLET | ORAL | Status: DC | PRN

## 2017-05-13 MED ORDER — OXYCODONE-ACETAMINOPHEN 5-325 MG PO TABS: 2.0000 | ORAL_TABLET | ORAL | Status: DC | PRN

## 2017-05-13 MED ORDER — FENTANYL 2.5 MCG/ML BUPIVACAINE 1/10 % EPIDURAL INFUSION (WH - ANES)
14.0000 mL/h | INTRAMUSCULAR | Status: DC | PRN
  Administered 2017-05-13 (×3): 14 mL/h via EPIDURAL
  Filled 2017-05-13: qty 100

## 2017-05-13 MED ORDER — LACTATED RINGERS IV SOLN
INTRAVENOUS | Status: DC
  Administered 2017-05-13 (×3): via INTRAVENOUS

## 2017-05-13 MED ORDER — LACTATED RINGERS IV SOLN: 500.0000 mL | Freq: Once | INTRAVENOUS | Status: DC

## 2017-05-13 MED ORDER — OXYTOCIN BOLUS FROM INFUSION
500.0000 mL | Freq: Once | INTRAVENOUS | Status: AC
  Administered 2017-05-13: 500 mL via INTRAVENOUS

## 2017-05-13 MED ORDER — ACETAMINOPHEN 325 MG PO TABS: 650.0000 mg | ORAL_TABLET | ORAL | Status: DC | PRN

## 2017-05-13 MED ORDER — OXYTOCIN 40 UNITS IN LACTATED RINGERS INFUSION - SIMPLE MED
1.0000 m[IU]/min | INTRAVENOUS | Status: DC
Start: 2017-05-13 — End: 2017-05-14
  Administered 2017-05-13: 2 m[IU]/min via INTRAVENOUS

## 2017-05-13 MED ORDER — SOD CITRATE-CITRIC ACID 500-334 MG/5ML PO SOLN: 30.0000 mL | ORAL | Status: DC | PRN

## 2017-05-13 MED ORDER — DIPHENHYDRAMINE HCL 50 MG/ML IJ SOLN: 12.5000 mg | INTRAMUSCULAR | Status: DC | PRN

## 2017-05-13 MED ORDER — FENTANYL 2.5 MCG/ML BUPIVACAINE 1/10 % EPIDURAL INFUSION (WH - ANES): 14.0000 mL/h | INTRAMUSCULAR | Status: DC | PRN

## 2017-05-13 MED ORDER — FENTANYL 2.5 MCG/ML BUPIVACAINE 1/10 % EPIDURAL INFUSION (WH - ANES)
INTRAMUSCULAR | Status: AC
  Filled 2017-05-13: qty 100

## 2017-05-13 MED ORDER — LACTATED RINGERS IV SOLN
500.0000 mL | INTRAVENOUS | Status: DC | PRN
  Administered 2017-05-13: 500 mL via INTRAVENOUS

## 2017-05-13 MED ORDER — EPHEDRINE 5 MG/ML INJ
10.0000 mg | INTRAVENOUS | Status: DC | PRN
  Filled 2017-05-13: qty 2

## 2017-05-13 MED ORDER — ONDANSETRON HCL 4 MG/2ML IJ SOLN: 4.0000 mg | Freq: Four times a day (QID) | INTRAMUSCULAR | Status: DC | PRN

## 2017-05-13 MED ORDER — GLUCOSE 4 G PO CHEW: 1.0000 | CHEWABLE_TABLET | Freq: Once | ORAL | Status: DC

## 2017-05-13 MED ORDER — SODIUM CHLORIDE 0.9 % IV SOLN
INTRAVENOUS | Status: DC
  Administered 2017-05-13: 0.5 [IU]/h via INTRAVENOUS
  Filled 2017-05-13: qty 1

## 2017-05-13 NOTE — Progress Notes (Signed)
CBG rechecked at 2330 and was 84. Provider notified and ordered to discontinue gluco stabilizer. Recheck CBG at 0330 and call provider if outside of normal limits.

## 2017-05-13 NOTE — H&P (Signed)
LABOR AND DELIVERY ADMISSION HISTORY AND PHYSICAL NOTE  Cassandra Turner is a 31 y.o. female G3P2002 with IUP at [redacted]w[redacted]d by LMP c/w 19 wk [place] presenting for SOL.   Contractions started this AM. She is a known GDMA-2, and took her glyburide this AM. She thought her water broke, no pooling, but per MAU provider, +FERN noted. She still has a bulging bag of water still.  She reports positive fetal movement. She denies vaginal bleeding.   38 weeks [place] (yesterday):  EFW: 3337gm 7lb 6oz 75%, AC 91%.  Prenatal History/Complications:  Past Medical History: Past Medical History:  Diagnosis Date  . Gestational diabetes 2006    Past Surgical History: Past Surgical History:  Procedure Laterality Date  . NO PAST SURGERIES      Obstetrical History: OB History    Gravida Para Term Preterm AB Living   3 2 2     2    SAB TAB Ectopic Multiple Live Births           2      Social History: Social History   Social History  . Marital status: Married    Spouse name: N/A  . Number of children: N/A  . Years of education: N/A   Social History Main Topics  . Smoking status: Never Smoker  . Smokeless tobacco: Never Used  . Alcohol use No  . Drug use: No  . Sexual activity: Yes   Other Topics Concern  . None   Social History Narrative  . None    Family History: Family History  Problem Relation Age of Onset  . Cancer Mother        Thyroid Cancer  . Diabetes Sister   . Hypertension Sister   . Asthma Son     Allergies: No Known Allergies  Prescriptions Prior to Admission  Medication Sig Dispense Refill Last Dose  . aspirin EC 81 MG tablet Take 1 tablet (81 mg total) by mouth daily. 30 tablet 7 05/12/2017 at Unknown time  . glyBURIDE (DIABETA) 2.5 MG tablet 2.5mg  po bidac breakfast and dinner (Patient taking differently: Take 2.5 mg by mouth 2 (two) times daily with a meal. 2.5mg  po bidac breakfast and dinner) 60 tablet 3 05/12/2017 at Unknown time  . Prenatal Vit-Fe Fumarate-FA  (PRENATAL MULTIVITAMIN) TABS tablet Take 1 tablet by mouth daily at 12 noon.   05/12/2017 at Unknown time     Review of Systems   All systems reviewed and negative except as stated in HPI  Physical Exam Blood pressure 123/78, pulse 67, temperature 98 F (36.7 C), temperature source Oral, resp. rate 18, height 5\' 2"  (1.575 m), weight 162 lb (73.5 kg), last menstrual period 08/18/2016. General appearance: alert, cooperative, appears stated age and no distress Lungs: clear to auscultation bilaterally Heart: regular rate and rhythm Abdomen: soft, non-tender; bowel sounds normal Extremities: No calf swelling or tenderness Presentation: cephalic Fetal monitoring: 125 bpm, mod var, +accels, no decels Uterine activity: q3-4 min Dilation: 6 Effacement (%): 80 Station: -2, -3 Exam by:: [email or web address]   Prenatal labs: ABO, Rh: --/--/O POS, O POS (06/09 1130) Antibody: NEG (06/09 1130) Rubella: !Error! RPR: Non Reactive (04/04 0817)  HBsAg: Negative (12/18 0000)  HIV: Non Reactive (04/04 0817)  GBS: Negative (05/29 1128)  Early 3 hr Glucola: 82/209/162/122 Genetic screening:  neg Anatomy US: Normal  Prenatal Transfer Tool  Maternal Diabetes: Yes:  Diabetes Type:  Insulin/Medication controlled Genetic Screening: Normal Maternal Ultrasounds/Referrals: Normal Fetal Ultrasounds or other Referrals:  None Maternal  Substance Abuse:  No Significant Maternal Medications:  Meds include: Other: glyburide; baby ASA Significant Maternal Lab Results: Lab values include: Group B Strep negative  Results for orders placed or performed during the hospital encounter of 05/13/17 (from the past 24 hour(s))  POCT fern test   Collection Time: 05/13/17 11:23 AM  Result Value Ref Range   POCT Fern Test Positive = ruptured amniotic membanes   CBC   Collection Time: 05/13/17 11:30 AM  Result Value Ref Range   WBC 9.0 4.0 - 10.5 K/uL   RBC 3.95 3.87 - 5.11 MIL/uL   Hemoglobin 12.8 12.0 - 15.0 g/dL    HCT 37.3 36.0 - 46.0 %   MCV 94.4 78.0 - 100.0 fL   MCH 32.4 26.0 - 34.0 pg   MCHC 34.3 30.0 - 36.0 g/dL   RDW 14.8 11.5 - 15.5 %   Platelets 193 150 - 400 K/uL  Type and screen [place]   Collection Time: 05/13/17 11:30 AM  Result Value Ref Range   ABO/RH(D) O POS    Antibody Screen NEG    Sample Expiration 05/16/2017   ABO/Rh   Collection Time: 05/13/17 11:30 AM  Result Value Ref Range   ABO/RH(D) O POS   Glucose, capillary   Collection Time: 05/13/17  1:31 PM  Result Value Ref Range   Glucose-Capillary 47 (L) 65 - 99 mg/dL   Comment 1 Notify RN   Glucose, capillary   Collection Time: 05/13/17  1:51 PM  Result Value Ref Range   Glucose-Capillary 65 65 - 99 mg/dL   Comment 1 Notify RN   Glucose, capillary   Collection Time: 05/13/17  3:59 PM  Result Value Ref Range   Glucose-Capillary 55 (L) 65 - 99 mg/dL  Glucose, capillary   Collection Time: 05/13/17  4:24 PM  Result Value Ref Range   Glucose-Capillary 69 65 - 99 mg/dL    Patient Active Problem List   Diagnosis Date Noted  . Indication for care in labor or delivery 05/13/2017  . Language barrier 04/03/2017  . Pregnancy, supervision, high-risk 12/19/2016  . Gestational diabetes mellitus (GDM) affecting pregnancy, antepartum 12/19/2016    Assessment: Cassandra Turner is a 31 y.o. G3P2002 at [redacted]w[redacted]d here for SOL/SROM.   #Labor:SOL, likely SROM #Pain: Epidural in place, comfortable #FWB: Cat I #ID:  GBS Neg #MOF: Breast #MOC:POPs #Circ:  N/A -girl #GDMA2: CBG q2h, took glyburide this AM, has had low sugars (50-60s), drinking juice to help stabilize.  Katherine Basset, DO OB Fellow Center for [facility], [facility] 05/13/2017, 4:40 PM

## 2017-05-13 NOTE — Anesthesia Procedure Notes (Signed)
Epidural Patient location during procedure: OB  Staffing Anesthesiologist: Nolon Nations Performed: anesthesiologist   Preanesthetic Checklist Completed: patient identified, pre-op evaluation, timeout performed, IV checked, risks and benefits discussed and monitors and equipment checked  Epidural Patient position: sitting Prep: site prepped and draped and DuraPrep Patient monitoring: heart rate Approach: midline Location: L3-L4 Injection technique: LOR air and LOR saline  Needle:  Needle type: Tuohy  Needle gauge: 17 G Needle length: 9 cm Needle insertion depth: 4 cm Catheter type: closed end flexible Catheter size: 19 Gauge Catheter at skin depth: 10 cm Test dose: negative  Assessment Sensory level: T8 Events: blood not aspirated, injection not painful, no injection resistance, negative IV test and no paresthesia  Additional Notes Reason for block:procedure for pain

## 2017-05-13 NOTE — MAU Note (Signed)
Patient presents with leaking amniotic fluid and vaginal bleeding which started this morning, has history of GDM

## 2017-05-13 NOTE — MAU Provider Note (Signed)
History   [id number]   Chief Complaint  Patient presents with  . Rupture of Membranes  . Vaginal Bleeding    HPI Cassandra Turner is a 31 y.o. female  G3P2002 here with report of LOF-clear.  Leaking of fluid has continued.  Pt reports regular contractions. Some bloody show present. She reports + fetal movement. All other systems negative.    Patient's last menstrual period was 08/18/2016.  OB History  Gravida Para Term Preterm AB Living  3 2 2     2   SAB TAB Ectopic Multiple Live Births          2    # Outcome Date GA Lbr Len/2nd Weight Sex Delivery Anes PTL Lv  3 Current           2 Term 07/22/09 [redacted]w[redacted]d  2.722 kg (6 lb) F Vag-Spont  N LIV  1 Term 03/11/05 [redacted]w[redacted]d  2.807 kg (6 lb 3 oz) M Vag-Spont EPI N LIV      Past Medical History:  Diagnosis Date  . Gestational diabetes 2006    Family History  Problem Relation Age of Onset  . Cancer Mother        Thyroid Cancer  . Diabetes Sister   . Hypertension Sister   . Asthma Son     Social History   Social History  . Marital status: Married    Spouse name: N/A  . Number of children: N/A  . Years of education: N/A   Social History Main Topics  . Smoking status: Never Smoker  . Smokeless tobacco: Never Used  . Alcohol use No  . Drug use: No  . Sexual activity: Yes   Other Topics Concern  . None   Social History Narrative  . None    No Known Allergies  No current facility-administered medications on file prior to encounter.    Current Outpatient Prescriptions on File Prior to Encounter  Medication Sig Dispense Refill  . aspirin EC 81 MG tablet Take 1 tablet (81 mg total) by mouth daily. 30 tablet 7  . glyBURIDE (DIABETA) 2.5 MG tablet 2.5mg  po bidac breakfast and dinner (Patient taking differently: Take 2.5 mg by mouth 2 (two) times daily with a meal. 2.5mg  po bidac breakfast and dinner) 60 tablet 3     Review of Systems  Gastrointestinal: Positive for abdominal pain.  Genitourinary: Positive  for vaginal bleeding (show) and vaginal discharge.     Physical Exam   Vitals:   05/13/17 1049 05/13/17 1053  BP:  (!) 142/86  Pulse:  73  Resp:  18  Temp:  98.8 F (37.1 C)  TempSrc:  Oral  Weight: 73.5 kg (162 lb)     Physical Exam  Nursing note and vitals reviewed. Constitutional: She is oriented to person, place, and time. She appears well-developed and well-nourished. No distress.  HENT:  Head: Normocephalic and atraumatic.  Neck: Normal range of motion.  Cardiovascular: Normal rate.   Respiratory: Effort normal.  GI: Soft. She exhibits no distension. There is no tenderness.  Genitourinary:  Genitourinary Comments: External: no lesions or erythema Vagina: rugated, pink, moist, thin clear discharge, no pool, +fern Cervix 4/60/-2, vtx  Musculoskeletal: Normal range of motion.  Neurological: She is alert and oriented to person, place, and time.  Skin: Skin is warm and dry.  Psychiatric: She has a normal mood and affect.   EFM: 150 bpm, mod variability, + accels, no decels Toco: 3-4  Results for orders placed or performed during  the hospital encounter of 05/13/17 (from the past 24 hour(s))  POCT fern test     Status: Abnormal   Collection Time: 05/13/17 11:23 AM  Result Value Ref Range   POCT Fern Test Positive = ruptured amniotic membanes    MAU Course  Procedures  MDM Labs ordered and reviewed.  Assessment and Plan  [redacted] weeks gestation Reactive NST SROM Admit to [facility] per labor team  Julianne Handler, CNM 05/13/2017 11:14 AM

## 2017-05-13 NOTE — Anesthesia Pain Management Evaluation Note (Signed)
  CRNA Pain Management Visit Note  Patient: Cassandra Turner, 31 y.o., female  "Hello I am a member of the anesthesia team at [facility]. We have an anesthesia team available at all times to provide care throughout the hospital, including epidural management and anesthesia for C-section. I don't know your plan for the delivery whether it a natural birth, water birth, IV sedation, nitrous supplementation, doula or epidural, but we want to meet your pain goals."   1.Was your pain managed to your expectations on prior hospitalizations?   Yes   2.What is your expectation for pain management during this hospitalization?     Epidural  3.How can we help you reach that goal? Epidural in place  Record the patient's initial score and the patient's pain goal.   Pain: 1  Pain Goal: 6 [facility] wants you to be able to say your pain was always managed very well.  Cassandra Turner 05/13/2017

## 2017-05-13 NOTE — Progress Notes (Signed)
Tosca Pletz is a 31 y.o. G3P2002 at [redacted]w[redacted]d  admitted for active labor  Subjective: Patient comfortable with epidural. Feels some pelvic pressure but not increased from earlier.  Objective: Vitals:   05/13/17 1930 05/13/17 2000 05/13/17 2033 05/13/17 2101  BP: 133/88 123/75 128/72 137/75  Pulse: 78 73 63 71  Resp: 17 16 16 16   Temp:  98.5 F (36.9 C)    TempSrc:  Oral    Weight:      Height:       Total I/O In: -  Out: 725 [Urine:725]  FHT:  FHR: 120 bpm, variability: moderate,  accelerations:  Present,  decelerations:  Absent UC:   regular, every 1-2 minutes SVE:   Dilation: 8 Effacement (%): 80 Station: 0, -1 Exam by:: Adrian Prince, RNC Pitocin @ 12 mu/min  Labs: Lab Results  Component Value Date   WBC 9.0 05/13/2017   HGB 12.8 05/13/2017   HCT 37.3 05/13/2017   MCV 94.4 05/13/2017   PLT 193 05/13/2017    Assessment / Plan: IUP at term. SOL. GBS neg. GDMa1 on glucose stabilizer  Plan: continue current plan of care. Anticipate NSVD soon.   [name] SNM 05/13/2017, 9:26 PM

## 2017-05-13 NOTE — Progress Notes (Signed)
Hypoglycemic Event  CBG: 55  Treatment: 15 GM carbohydrate snack  Symptoms: None  Follow-up CBG: Time:1624 CBG Result:69  Possible Reasons for Event: Unknown  Comments/MD notified:1605 dr Vanetta Shawl  1630 to Repeat in 2 hours    Cassandra Turner

## 2017-05-13 NOTE — Anesthesia Preprocedure Evaluation (Signed)
Anesthesia Evaluation  Patient identified by MRN, date of birth, ID band Patient awake    Reviewed: Allergy & Precautions, NPO status , Patient's Chart, lab work & pertinent test results  Airway Mallampati: II  TM Distance: >3 FB Neck ROM: Full    Dental no notable dental hx.    Pulmonary neg pulmonary ROS,    Pulmonary exam normal breath sounds clear to auscultation       Cardiovascular negative cardio ROS Normal cardiovascular exam Rhythm:Regular Rate:Normal     Neuro/Psych negative neurological ROS  negative psych ROS   GI/Hepatic negative GI ROS, Neg liver ROS,   Endo/Other  diabetes, Gestational  Renal/GU negative Renal ROS     Musculoskeletal negative musculoskeletal ROS (+)   Abdominal   Peds  Hematology negative hematology ROS (+)   Anesthesia Other Findings   Reproductive/Obstetrics (+) Pregnancy                             Anesthesia Physical Anesthesia Plan  ASA: II  Anesthesia Plan: Epidural   Post-op Pain Management:    Induction:   PONV Risk Score and Plan:   Airway Management Planned:   Additional Equipment:   Intra-op Plan:   Post-operative Plan:   Informed Consent: I have reviewed the patients History and Physical, chart, labs and discussed the procedure including the risks, benefits and alternatives for the proposed anesthesia with the patient or authorized representative who has indicated his/her understanding and acceptance.     Plan Discussed with:   Anesthesia Plan Comments:         Anesthesia Quick Evaluation

## 2017-05-13 NOTE — Progress Notes (Signed)
Dr Vanetta Shawl notified of CBG 141 and pt had icee pop 20 mins before CBG evaluation. TO recheck CBG in  1 hour and reevaluate.

## 2017-05-13 NOTE — Progress Notes (Signed)
Hypoglycemic Event  CBG: 47         Treatment: 15 GM carbohydrate snack  Symptoms: None  Follow-up CBG: Time 1350 CBG Result: TBA  Possible Reasons for Event: Change in activity-LABOR  Comments/MD notified: notified 05/13/17 [place], [place] Ceo

## 2017-05-14 LAB — RPR: RPR Ser Ql: NONREACTIVE

## 2017-05-14 LAB — GLUCOSE, CAPILLARY: Glucose-Capillary: 79 mg/dL (ref 65–99)

## 2017-05-14 MED ORDER — SIMETHICONE 80 MG PO CHEW: 80.0000 mg | CHEWABLE_TABLET | ORAL | Status: DC | PRN

## 2017-05-14 MED ORDER — OXYCODONE HCL 5 MG PO TABS: 5.0000 mg | ORAL_TABLET | ORAL | Status: DC | PRN

## 2017-05-14 MED ORDER — METHYLERGONOVINE MALEATE 0.2 MG/ML IJ SOLN: 0.2000 mg | INTRAMUSCULAR | Status: DC | PRN

## 2017-05-14 MED ORDER — METHYLERGONOVINE MALEATE 0.2 MG PO TABS: 0.2000 mg | ORAL_TABLET | ORAL | Status: DC | PRN

## 2017-05-14 MED ORDER — COCONUT OIL OIL: 1.0000 "application " | TOPICAL_OIL | Status: DC | PRN

## 2017-05-14 MED ORDER — TETANUS-DIPHTH-ACELL PERTUSSIS 5-2.5-18.5 LF-MCG/0.5 IM SUSP: 0.5000 mL | Freq: Once | INTRAMUSCULAR | Status: DC

## 2017-05-14 MED ORDER — ACETAMINOPHEN 325 MG PO TABS
650.0000 mg | ORAL_TABLET | ORAL | Status: DC | PRN
  Administered 2017-05-14: 650 mg via ORAL
  Filled 2017-05-14: qty 2

## 2017-05-14 MED ORDER — ZOLPIDEM TARTRATE 5 MG PO TABS: 5.0000 mg | ORAL_TABLET | Freq: Every evening | ORAL | Status: DC | PRN

## 2017-05-14 MED ORDER — OXYCODONE HCL 5 MG PO TABS
10.0000 mg | ORAL_TABLET | ORAL | Status: DC | PRN
Start: 2017-05-14 — End: 2017-05-15

## 2017-05-14 MED ORDER — BENZOCAINE-MENTHOL 20-0.5 % EX AERO
1.0000 | INHALATION_SPRAY | CUTANEOUS | Status: DC | PRN
Start: 2017-05-14 — End: 2017-05-15
  Administered 2017-05-14: 1 via TOPICAL
  Filled 2017-05-14 (×2): qty 56

## 2017-05-14 MED ORDER — IBUPROFEN 600 MG PO TABS
600.0000 mg | ORAL_TABLET | Freq: Four times a day (QID) | ORAL | Status: DC
  Administered 2017-05-14 – 2017-05-15 (×7): 600 mg via ORAL
  Filled 2017-05-14 (×7): qty 1

## 2017-05-14 MED ORDER — ONDANSETRON HCL 4 MG/2ML IJ SOLN: 4.0000 mg | INTRAMUSCULAR | Status: DC | PRN

## 2017-05-14 MED ORDER — ONDANSETRON HCL 4 MG PO TABS: 4.0000 mg | ORAL_TABLET | ORAL | Status: DC | PRN

## 2017-05-14 MED ORDER — MEASLES, MUMPS & RUBELLA VAC ~~LOC~~ INJ
0.5000 mL | INJECTION | Freq: Once | SUBCUTANEOUS | Status: DC
  Filled 2017-05-14: qty 0.5

## 2017-05-14 MED ORDER — WITCH HAZEL-GLYCERIN EX PADS: 1.0000 "application " | MEDICATED_PAD | CUTANEOUS | Status: DC | PRN

## 2017-05-14 MED ORDER — DIPHENHYDRAMINE HCL 25 MG PO CAPS: 25.0000 mg | ORAL_CAPSULE | Freq: Four times a day (QID) | ORAL | Status: DC | PRN

## 2017-05-14 MED ORDER — SENNOSIDES-DOCUSATE SODIUM 8.6-50 MG PO TABS
2.0000 | ORAL_TABLET | ORAL | Status: DC
  Administered 2017-05-14 – 2017-05-15 (×2): 2 via ORAL
  Filled 2017-05-14 (×2): qty 2

## 2017-05-14 MED ORDER — PRENATAL MULTIVITAMIN CH
1.0000 | ORAL_TABLET | Freq: Every day | ORAL | Status: DC
  Administered 2017-05-14 – 2017-05-15 (×2): 1 via ORAL
  Filled 2017-05-14 (×2): qty 1

## 2017-05-14 MED ORDER — DIBUCAINE 1 % RE OINT: 1.0000 "application " | TOPICAL_OINTMENT | RECTAL | Status: DC | PRN

## 2017-05-14 NOTE — Anesthesia Postprocedure Evaluation (Signed)
Anesthesia Post Note  Patient: Cassandra Turner  Procedure(s) Performed: * No procedures listed *     Patient location during evaluation: Mother Baby Anesthesia Type: Epidural Level of consciousness: awake and alert and oriented Pain management: pain level controlled Vital Signs Assessment: post-procedure vital signs reviewed and stable Respiratory status: spontaneous breathing and nonlabored ventilation Cardiovascular status: stable Postop Assessment: no headache, patient able to bend at knees, no backache, no signs of nausea or vomiting, epidural receding and adequate PO intake Anesthetic complications: no    Last Vitals:  Vitals:   05/14/17 0150 05/14/17 0555  BP: 132/68 (!) 141/74  Pulse: 71 (!) 58  Resp: 18 20  Temp: 36.6 C 36.8 C    Last Pain:  Vitals:   05/14/17 0818  TempSrc:   PainSc: 5    Pain Goal: Patients Stated Pain Goal: 5 (05/13/17 1155)               Crisol Muecke Hristova

## 2017-05-14 NOTE — Progress Notes (Signed)
Post Partum Day 1 Subjective: no complaints, up ad lib, voiding and tolerating PO  Objective: Blood pressure (!) 141/74, pulse (!) 58, temperature 98.2 F (36.8 C), temperature source Oral, resp. rate 20, height 5\' 2"  (1.575 m), weight 162 lb (73.5 kg), last menstrual period 08/18/2016, SpO2 98 %, unknown if currently breastfeeding.  Physical Exam:  General: alert, cooperative, appears stated age and no distress Lochia: appropriate Uterine Fundus: firm Incision: n/a DVT Evaluation: No evidence of DVT seen on physical exam.   Recent Labs  05/13/17 1130  HGB 12.8  HCT 37.3    Assessment/Plan: Plan for discharge tomorrow   LOS: 1 day   Cassandra Turner 05/14/2017, 9:14 AM

## 2017-05-14 NOTE — Lactation Note (Signed)
This note was copied from a baby's chart. Lactation Consultation Note  P3, Ex BF for 6 months.  16 hours old. [place] [phone] used for [place]ola. Mother states she knows how to hand express. Denies problems or questions. Mom encouraged to feed baby 8-12 times/24 hours and with feeding cues.  Mom made aware of O/P services, breastfeeding support groups, community resources, and our phone # for post-discharge questions.    Patient Name: Cassandra Turner [id number] Date: 05/14/2017 Reason for consult: Initial assessment   Maternal Data Does the patient have breastfeeding experience prior to this delivery?: Yes  Feeding Feeding Type: Breast Fed Length of feed: 30 min  LATCH Score/Interventions Latch: Grasps breast easily, tongue down, lips flanged, rhythmical sucking. Intervention(s): Adjust position;Assist with latch;Breast massage  Audible Swallowing: A few with stimulation Intervention(s): Skin to skin;Hand expression  Type of Nipple: Everted at rest and after stimulation  Comfort (Breast/Nipple): Soft / non-tender     Hold (Positioning): Assistance needed to correctly position infant at breast and maintain latch. Intervention(s): Breastfeeding basics reviewed;Support Pillows;Position options;Skin to skin  LATCH Score: 8  Lactation Tools Discussed/Used     Consult Status Consult Status: Complete Date: 05/15/17 Follow-up type: In-patient    Vivianne Master [facility] 05/14/2017, 3:32 PM

## 2017-05-15 ENCOUNTER — Other Ambulatory Visit: Payer: Self-pay | Admitting: Advanced Practice Midwife

## 2017-05-15 MED ORDER — IBUPROFEN 600 MG PO TABS: 600.0000 mg | ORAL_TABLET | Freq: Four times a day (QID) | ORAL | 0 refills | Status: DC

## 2017-05-15 MED ORDER — AMLODIPINE BESYLATE 5 MG PO TABS
5.0000 mg | ORAL_TABLET | Freq: Every day | ORAL | Status: DC
  Administered 2017-05-15: 5 mg via ORAL
  Filled 2017-05-15 (×2): qty 1

## 2017-05-15 MED ORDER — SENNOSIDES-DOCUSATE SODIUM 8.6-50 MG PO TABS: 1.0000 | ORAL_TABLET | Freq: Every evening | ORAL | 0 refills | Status: DC | PRN

## 2017-05-15 MED ORDER — AMLODIPINE BESYLATE 5 MG PO TABS: 5.0000 mg | ORAL_TABLET | Freq: Every day | ORAL | 0 refills | Status: DC

## 2017-05-15 NOTE — Discharge Instructions (Signed)
Instrucciones para la mamá sobre los cuidados en el hogar °(Home Care Instructions for Mom) °ACTIVIDAD °· Reanude sus actividades regulares de forma gradual. °· Descanse. Tome siestas cuando el bebé duerme. °· No levante objetos que pesen más de 10 libras (4,5 kg) hasta que el médico se lo autorice. °· Evite las actividades que demandan mucho esfuerzo y energía (que son extenuantes) hasta que el médico se lo autorice. Caminar a un ritmo tranquilo a moderado siempre es más seguro. °· Si tuvo un parto por cesárea: °? No pase la aspiradora, suba escaleras o conduzca un vehículo durante 4 o 6 semanas. °? Pídale a alguien que le brinde ayuda con las tareas domésticas hasta que pueda realizarlas por su cuenta. °? Haga ejercicios como se lo haya indicado el médico, si corresponde. ° °HEMORRAGIA VAGINAL °Probablemente continúe sangrando durante 4 o 6 semanas después del parto. Generalmente, la cantidad de sangre disminuye y el color se hace más claro con el transcurso del tiempo. Sin embargo, si usted está demasiado activa, el color de la sangre puede ser rojo brillante. Si necesita cambiarse la compresa higiénica en menos de una hora o tiene coágulos grandes: °· Permanezca acostada. °· Eleve los pies. °· Coloque compresas frías en la zona inferior del abdomen. °· Haga reposo. °· Comuníquese con su médico. °Si está amamantando, podría volver a tener su período entre las 8 semanas después del parto y el momento en que deje de amamantar. Si no está amamantando, volverá a tener su período 6 u 8 semanas después del parto. °CUIDADOS PERINEALES °La zona perineal o perineo, es la parte del cuerpo que se encuentra entre los muslos. Después del parto, esta zona necesita un cuidado especial. Siga las siguientes indicaciones como se lo haya indicado su médico. °· Tome baños de inmersión durante 15 o 20 minutos. °· Utilice apósitos o aerosoles analgésicos y cremas como se lo hayan indicado. °· No utilice tampones ni se haga duchas  vaginales hasta que el sangrado vaginal se haya detenido. °· Cada vez que vaya al baño: °? Use una botella perineal. °? Cámbiese el apósito. °? Use papel tisú en lugar de papel higiénico hasta que se cure la sutura. °· Haga ejercicios de Kegel todos los días. Los ejercicios Kegel ayudan a mantener los músculos que sostienen la vagina, la vejiga y los intestinos. Estos ejercicios se pueden realizar mientras está parada, sentada o acostada. Para hacer los ejercicios de Kegel: °? Tense los músculos del estómago y los que rodean el canal de parto. °? Mantenga esta posición durante unos segundos. °? Relájese. °? Repita hasta hacerlos 5 veces seguidas. °· Para evitar las hemorroides o que estas empeoren: °? Beba suficiente líquido para mantener la orina clara o de color amarillo pálido. °? Evite hacer fuerza al defecar. °? Tome los medicamentos y laxantes de venta libre como se lo haya indicado el médico. °CUIDADO DE LAS MAMAS °· Use un buen sostén. °· Evite tomar analgésicos de venta libre para las molestias de los pechos. °· Aplique hielo en los pechos para aliviar las molestias tanto como sea necesario: °? Ponga el hielo en una bolsa plástica. °? Coloque una toalla entre la piel y la bolsa de hielo. °? Aplique el hielo durante 20, o como se lo haya indicado el médico. ° °NUTRICIÓN °· Mantenga una dieta bien balanceada. °· No intente perder de peso rápidamente reduciendo el consumo de calorías. °· Tome sus vitaminas prenatales hasta el control de postparto o hasta que su médico se lo indique. ° °DEPRESIÓN POSTPARTO °  Puede sentir deseos de llorar sin motivo aparente y verse incapaz de enfrentarse a todos los cambios que implica tener un bebé. Este estado de ánimo se llama depresión postparto. La depresión postparto ocurre porque sus niveles hormonales sufren cambios después del parto. Si usted tiene depresión postparto, busque contención por parte de su pareja, sus amigos y su familia. Si la depresión no desaparece por  sí sola después de algunas semanas, concurra a su médico. °AUTOEXAMEN DE MAMAS °Realícese autoexámenes en el mismo momento cada mes. Si está amamantando, el mejor momento de controlar sus mamas es después de alimentar al bebé, cuando los pechos no están tan llenos. Si está amamantando y su período ya comenzó, controle sus mamas el día 5, 6 o 7 de su período. °Informe a su médico de cualquier protuberancia, bulto o secreción. Si está amamantando, las mamas normalmente tienen bultos. Esto es transitorio y no es un riesgo para la salud. °INTIMIDAD Y SEXUALIDAD °Debe evitar las relaciones sexuales durante al menos 3 o 4 semanas después del parto o hasta que el flujo de color rojo amarronado haya desaparecido completamente. Si no desea quedar embarazada nuevamente, use algún método anticonceptivo. Después del parto, puede quedar embarazada incluso si no ha tenido todavía el período. °SOLICITE ATENCIÓN MÉDICA SI: °· Se siente incapaz de controlar los cambios que implica tener un hijo y esos sentimientos no desaparecen después de algunas semanas. °· Detecta una protuberancia, bulto o secreción en sus mamas. ° °SOLICITE ATENCIÓN MÉDICA DE INMEDIATO SI: °· Debe cambiarse la compresa higiénica en 1 hora o menos. °· Tiene los siguientes síntomas: °? Dolor intenso o calambres en la parte inferior del abdomen. °? Una secreción vaginal con mal olor. °? Fiebre que no se alivia con los medicamentos. °? Una zona de la mama se pone roja y le causa dolor, y además usted tiene fiebre. °? Una pantorrilla enrojecida y con dolor. °? Repentino e intenso dolor en el pecho. °? Falta de aire. °? Micción dolorosa o con sangre. °? Problemas visuales. °· Vómitos durante 12 horas o más. °· Dolor de cabeza intenso. °· Tiene pensamientos serios acerca de lastimarse a usted misma o dañar al niño o a otra persona. ° °Esta información no tiene como fin reemplazar el consejo del médico. Asegúrese de hacerle al médico cualquier pregunta que  tenga. °Document Released: 11/21/2005 Document Revised: 03/14/2016 Document Reviewed: 05/25/2015 °Elsevier Interactive Patient Education © 2017 [employer] ° °

## 2017-05-15 NOTE — Lactation Note (Signed)
This note was copied from a baby's chart. Lactation Consultation Note  Patient Name: Cassandra Turner [facility] Date: 05/15/2017 Reason for consult: Follow-up assessment [employer] interpreter [phone] Zaida used for visit. Mom reports baby is nursing well, denies questions/concerns. Exp. BF Mom. Encouraged to continue to BF with feeding ques. Engorgement care reviewed if needed. Advised of OP services and support group. Hand pump given demonstrated use/cleaning. Size 27 flange given. Encouraged Mom to call for questions/concerns.   Maternal Data    Feeding Feeding Type: Breast Fed Length of feed: 20 min  LATCH Score/Interventions                      Lactation Tools Discussed/Used Tools: Pump Breast pump type: Manual   Consult Status Consult Status: Complete Date: 05/15/17 Follow-up type: In-patient    Katrine Coho 05/15/2017, 11:02 AM

## 2017-05-15 NOTE — Progress Notes (Signed)
Post discharge chart review completed.  

## 2017-05-15 NOTE — Discharge Summary (Signed)
OB Discharge Summary     Patient Name: Cassandra Turner DOB: 05-17-1986 MRN: [id number]  Date of admission: 05/13/2017 Delivering MD: Wende Mott   Date of discharge: 05/15/2017  Admitting diagnosis: 4WKS CONTRACTIONS 5-9MINS BLEEDING WATER BROKE Intrauterine pregnancy: [redacted]w[redacted]d     Secondary diagnosis:  Active Problems:   Indication for care in labor or delivery  Additional problems:  Patient Active Problem List   Diagnosis Date Noted  . Indication for care in labor or delivery 05/13/2017  . Language barrier 04/03/2017  . Pregnancy, supervision, high-risk 12/19/2016  . Gestational diabetes mellitus (GDM) affecting pregnancy, antepartum 12/19/2016        Discharge diagnosis: Term Pregnancy Delivered                                                                                                Augmentation: AROM and Pitocin  Complications: None  Hospital course:  Onset of Labor With Vaginal Delivery     31 y.o. yo G3P3003 at [redacted]w[redacted]d was admitted in Labor on 05/13/2017. Patient had an uncomplicated labor course as follows:  Membrane Rupture Time/Date: 4:30 AM ,05/13/2017   Intrapartum Procedures: Episiotomy: None [1]                                         Lacerations:  None [1]  Patient had a delivery of a Viable infant. 05/13/2017  Information for the patient's newborn:  Cassandra Turner, Cassandra Turner Girl Cassandra Turner [[id number]]  Delivery Method: Vaginal, Spontaneous Delivery (Filed from Delivery Summary)    Pateint had an uncomplicated postpartum course.  She is ambulating, tolerating a regular diet, passing flatus, and urinating well. She denies any HAs, changes in vision, or RUQ/epigastric pain. Patient had mildly elevated BPs postpartum, was started on Norvasc 5mg  prior to discharge. Preeclampsia signs/symptoms for precautions given prior to discharge.  Patient is discharged home in stable condition on 05/15/17.   Physical exam  Vitals:   05/14/17 0555 05/14/17 1313 05/14/17 1841  05/15/17 0614  BP: (!) 141/74 (!) 142/72 135/83 (!) 141/73  Pulse: (!) 58 (!) 57 66 (!) 56  Resp: 20 18 18 20   Temp: 98.2 F (36.8 C) 98.6 F (37 C) 98.1 F (36.7 C) 98.2 F (36.8 C)  TempSrc: Oral Oral Oral Oral  SpO2: 98% 98%    Weight:      Height:       General: alert, cooperative and no distress Lochia: appropriate Uterine Fundus: firm DVT Evaluation: No evidence of DVT seen on physical exam. Labs: Lab Results  Component Value Date   WBC 9.0 05/13/2017   HGB 12.8 05/13/2017   HCT 37.3 05/13/2017   MCV 94.4 05/13/2017   PLT 193 05/13/2017   No flowsheet data found.  Discharge instruction: per After Visit Summary and "Baby and Me Booklet".  After visit meds:  Allergies as of 05/15/2017   No Known Allergies     Medication List    STOP taking these medications   aspirin EC 81 MG tablet  glyBURIDE 2.5 MG tablet Commonly known as:  DIABETA     TAKE these medications   amLODipine 5 MG tablet Commonly known as:  NORVASC Take 1 tablet (5 mg total) by mouth daily. Start taking on:  05/16/2017   ibuprofen 600 MG tablet Commonly known as:  ADVIL,MOTRIN Take 1 tablet (600 mg total) by mouth every 6 (six) hours.   prenatal multivitamin Tabs tablet Take 1 tablet by mouth daily at 12 noon.   senna-docusate 8.6-50 MG tablet Commonly known as:  Senokot-S Take 1 tablet by mouth at bedtime as needed for mild constipation.       Diet: routine diet  Activity: Advance as tolerated. Pelvic rest for 6 weeks.   Outpatient follow up:6 weeks, patient to have nursing follow up in one week for blood pressure check Follow up Appt:Future Appointments Date Time Provider [place]  06/26/2017 8:20 AM Cassandra Turner, CNM [facility] [place]   Follow up Visit:No Follow-up on file.  Postpartum contraception: Progesterone only pills  Newborn Data: Live born female  Birth Weight: 7 lb 8.8 oz (3425 g) APGAR: 9, 9  Baby Feeding: Breast Disposition:home with mother    Patient was sent with 5 mg Norvasc daily and blood pressure check in one week for mildly elevated pressures prior to discharge.   05/15/2017 Cassandra Coombe, MD  OB FELLOW DISCHARGE ATTESTATION  I have seen and examined this patient and agree with above documentation in the resident's note.   Cassandra Basset, DO OB Fellow

## 2017-05-18 ENCOUNTER — Inpatient Hospital Stay (HOSPITAL_COMMUNITY): Admission: RE | Admit: 2017-05-18 | Payer: Self-pay | Source: Ambulatory Visit

## 2017-05-19 ENCOUNTER — Ambulatory Visit: Payer: Self-pay | Admitting: *Deleted

## 2017-05-19 VITALS — BP 119/77 | HR 85

## 2017-05-19 DIAGNOSIS — O139 Gestational [pregnancy-induced] hypertension without significant proteinuria, unspecified trimester: Secondary | ICD-10-CM

## 2017-05-19 NOTE — Progress Notes (Signed)
Pt presented for BP check as scheduled due to PP elevated BP values. She denies H/A or visual disturbances. Consult w/Dr. Hulan Fray re: results.  Pt was advised to continue taking Norvasc 5 mg daily for one more week, then stop taking the medicine. Pt should keep PP appt as scheduled on 7/23.  Pt voiced understanding.

## 2017-06-22 IMAGING — US US MFM OB FOLLOW-UP
1 series · 13 of 28 positions shown · non-contrast
Comparison: none

[Series 1: us mfm ob follow-up · 36 acquisitions, 13 frames shown]
[im 2/36]
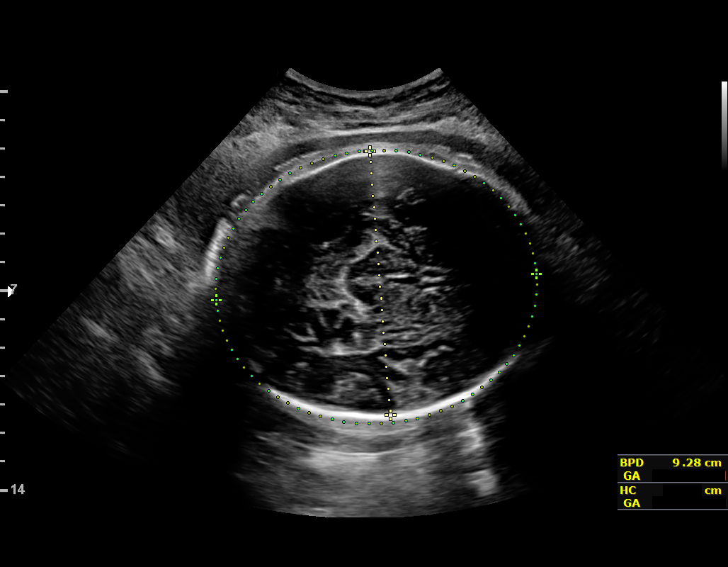
[im 4/36]
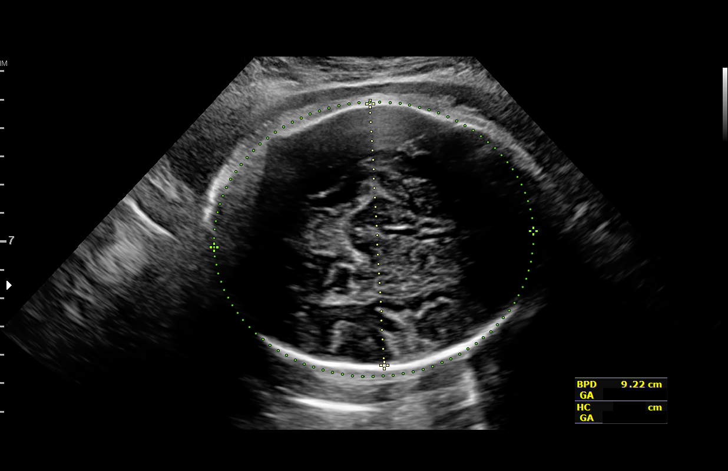
[im 7/36]
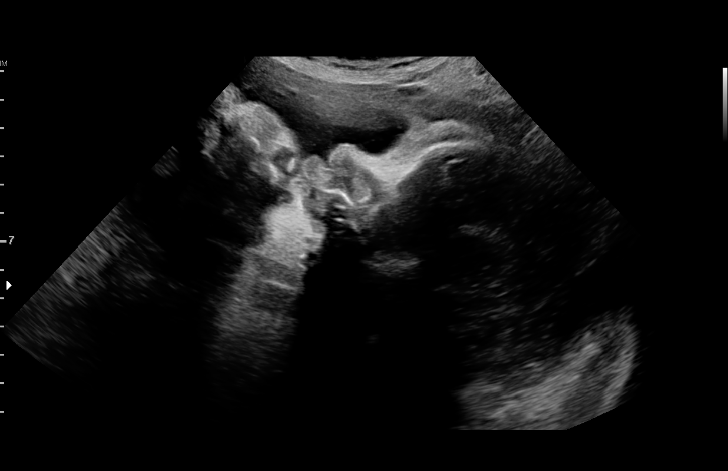
[im 10/36]
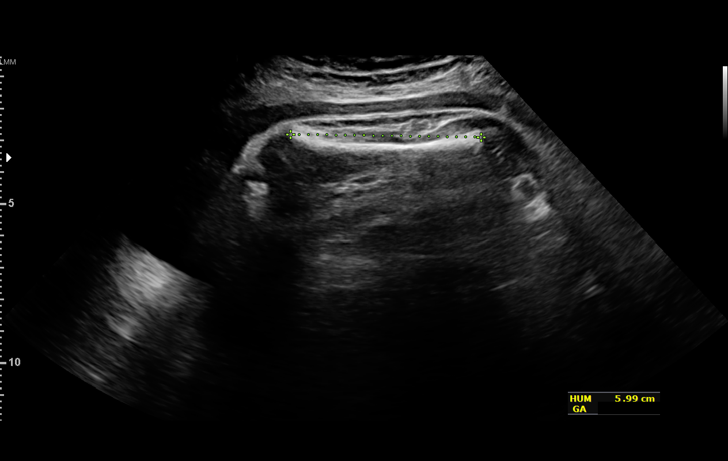
[im 12/36]
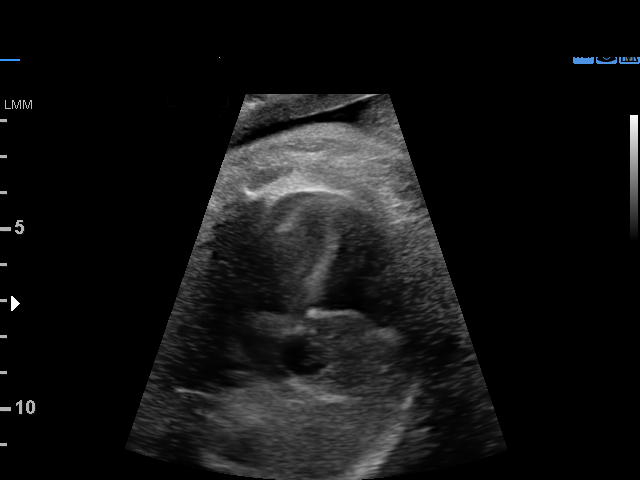
[im 15/36]
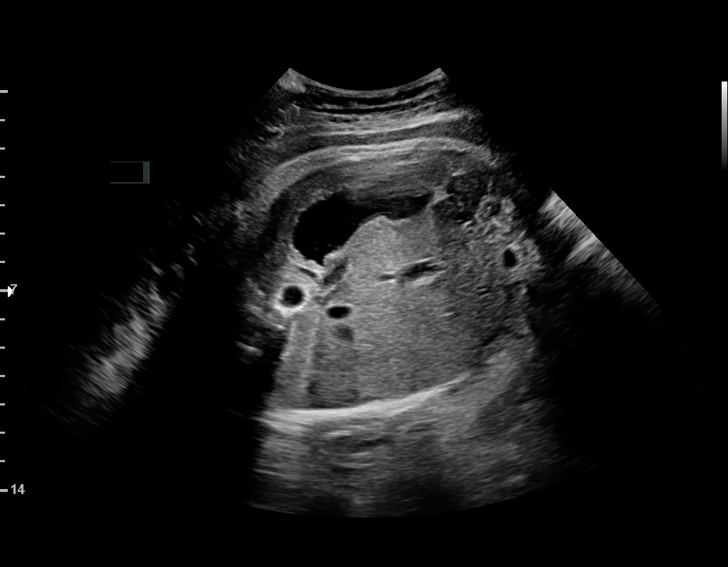
[im 19/36]
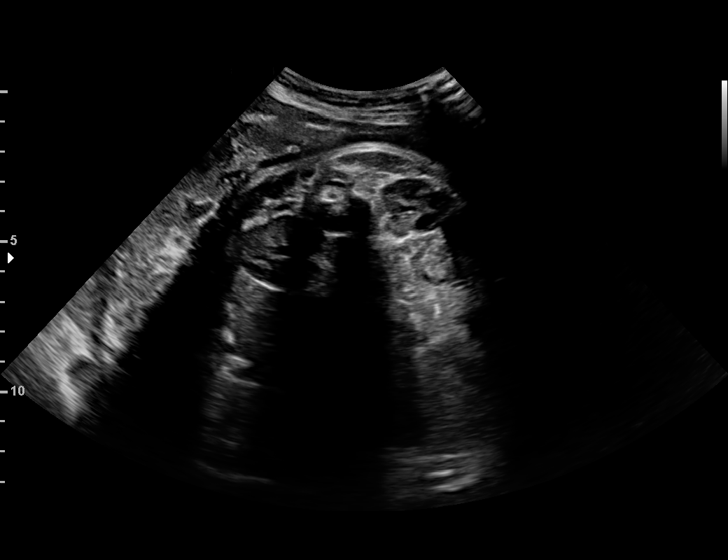
[im 21/36]
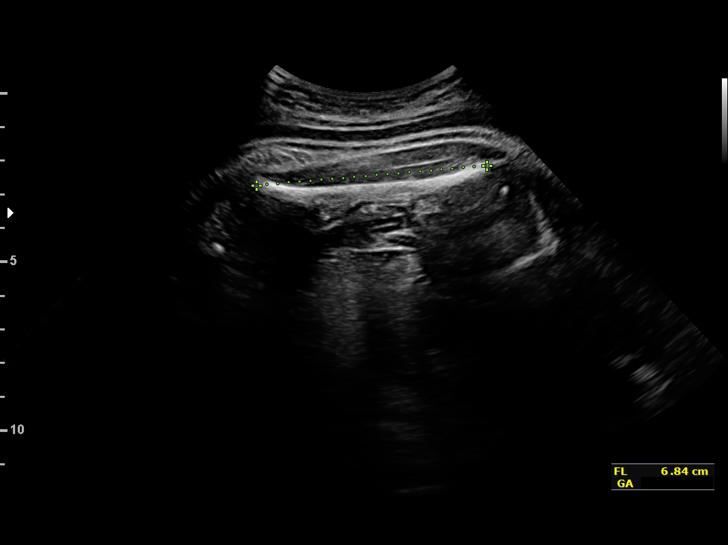
[im 24/36]
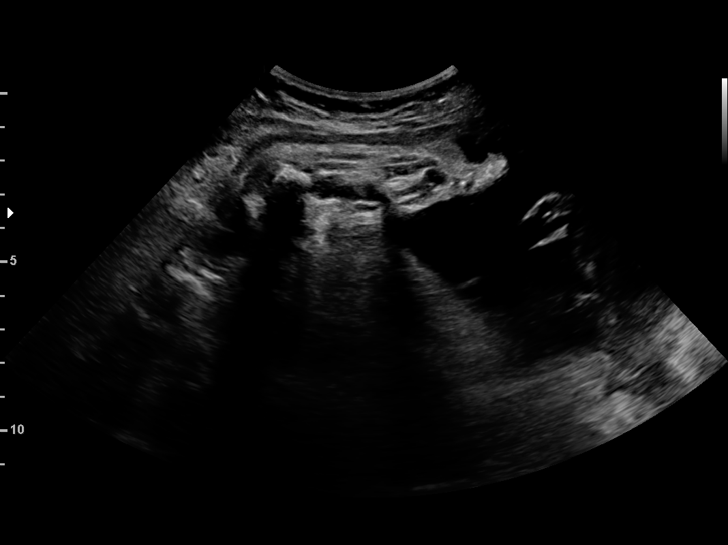
[im 26/36]
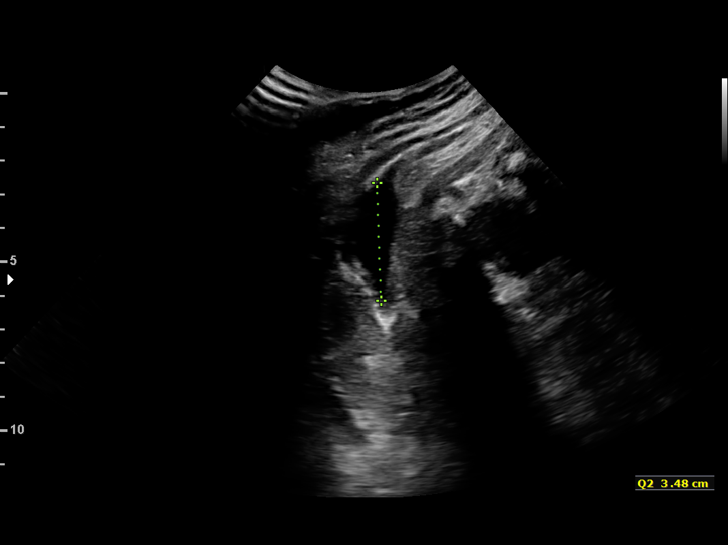
[im 29/36]
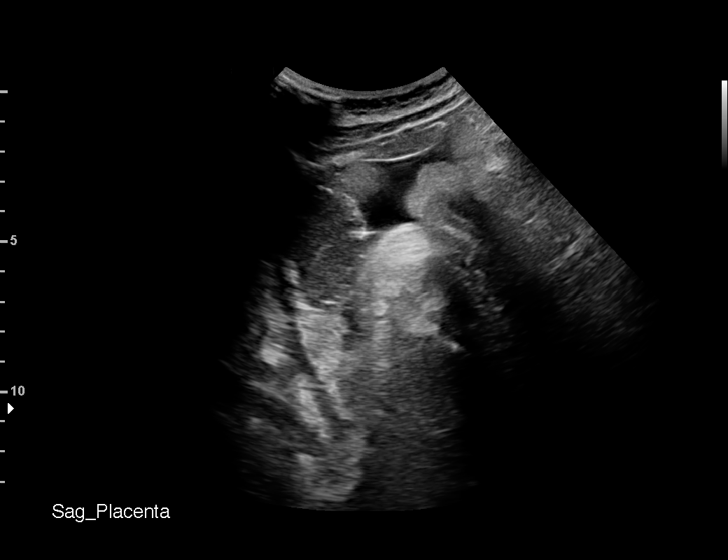
[im 32/36]
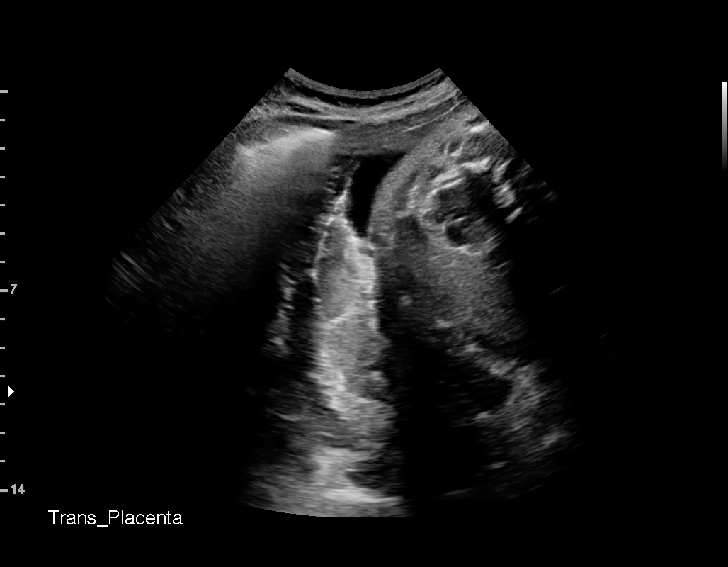
[im 34/36]
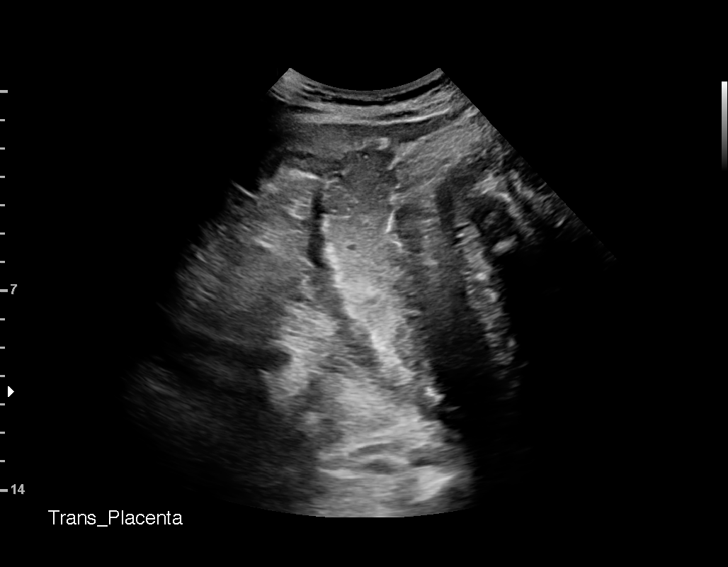

[13 of 28 positions shown; findings below may reference images not displayed]

[REDACTED]

1  ALI Y                [id number]      [id number]     [id number]
Indications

38 weeks gestation of pregnancy
Gestational diabetes in pregnancy, diet
controlled
OB History

Blood Type:            Height:  5'1"   Weight (lb):  141       BMI:
Gravidity:    3         Term:   2        Prem:   0        SAB:   0
TOP:          0       Ectopic:  0        Living: 2
Fetal Evaluation

Num Of Fetuses:     1
Fetal Heart         144
Rate(bpm):
Cardiac Activity:   Observed
Presentation:       Cephalic
Placenta:           Posterior, above cervical os
P. Cord Insertion:  Previously Visualized

Amniotic Fluid
AFI FV:      Subjectively within normal limits

AFI Sum(cm)     %Tile       Largest Pocket(cm)
15.29           59

RUQ(cm)       RLQ(cm)       LUQ(cm)        LLQ(cm)
2.85
Biometry
BPD:      92.5  mm     G. Age:  37w 4d         60  %    CI:        79.54   %    70 - 86
FL/HC:      20.6   %    20.9 -
HC:      327.8  mm     G. Age:  37w 2d         14  %    HC/AC:      0.92        0.92 -
AC:      354.7  mm     G. Age:  39w 3d         91  %    FL/BPD:     72.9   %    71 - 87
FL:       67.4  mm     G. Age:  34w 5d        < 3  %    FL/AC:      19.0   %    20 - 24
HUM:      59.5  mm     G. Age:  34w 4d          7  %

Est. FW:    6669  gm      7 lb 6 oz     75  %
Gestational Age

LMP:           38w 1d        Date:  08/18/16                 EDD:   05/25/17
U/S Today:     37w 2d                                        EDD:   05/31/17
Best:          38w 1d     Det. By:  LMP  (08/18/16)          EDD:   05/25/17
Anatomy

Cranium:               Appears normal         Aortic Arch:            Not well visualized
Cavum:                 Appears normal         Ductal Arch:            Not well visualized
Ventricles:            Appears normal         Diaphragm:              Previously seen
Choroid Plexus:        Previously seen        Stomach:                Appears normal, left
sided
Cerebellum:            Previously seen        Abdomen:                Appears normal
Posterior Fossa:       Previously seen        Abdominal Wall:         Previously seen
Nuchal Fold:           Previously seen        Cord Vessels:           Previously seen
Face:                  Orbits and profile     Kidneys:                Appear normal
previously seen
Lips:                  Previously seen        Bladder:                Appears normal
Thoracic:              Appears normal         Spine:                  Previously seen
Heart:                 Appears normal         Upper Extremities:      Previously seen
(4CH, axis, and situs
RVOT:                  Previously seen        Lower Extremities:      Previously seen
LVOT:                  Previously seen
Impression

Singleton intrauterine pregnancy at 38+1 weeks with GDM,
here for growth evaluation
Interval review of the anatomy shows no gross structural
anomalies
However, evaluation should be considered suboptimal
secondary to late EGA
Amniotic fluid volume is normal with an AFI of 15.3 cm
Estimated fetal weight is 3337g which is growth in the 75th
percentile
Recommendations

Follow-up ultrasounds as clinically indicated.

## 2017-06-26 ENCOUNTER — Ambulatory Visit: Payer: Self-pay | Admitting: Certified Nurse Midwife

## 2018-06-19 ENCOUNTER — Ambulatory Visit (INDEPENDENT_AMBULATORY_CARE_PROVIDER_SITE_OTHER): Payer: Self-pay | Admitting: Physician Assistant

## 2018-06-19 ENCOUNTER — Encounter (INDEPENDENT_AMBULATORY_CARE_PROVIDER_SITE_OTHER): Payer: Self-pay | Admitting: Physician Assistant

## 2018-06-19 VITALS — BP 114/77 | HR 74 | Temp 98.2°F | Ht 62.0 in | Wt 152.0 lb

## 2018-06-19 DIAGNOSIS — R55 Syncope and collapse: Secondary | ICD-10-CM

## 2018-06-19 DIAGNOSIS — Z131 Encounter for screening for diabetes mellitus: Secondary | ICD-10-CM

## 2018-06-19 DIAGNOSIS — R7303 Prediabetes: Secondary | ICD-10-CM

## 2018-06-19 LAB — POCT GLYCOSYLATED HEMOGLOBIN (HGB A1C): HEMOGLOBIN A1C: 5.8 % — AB (ref 4.0–5.6)

## 2018-06-19 NOTE — Progress Notes (Signed)
Subjective:  Patient ID: Cassandra Turner, female    DOB: June 17, 1986  Age: 32 y.o. MRN: [id number]  CC: syncope  HPI Cassandra Turner is a 32 y.o. female with a medical history of gestational diabetes presents as a new patient with complaint of sudden collapse and syncope since greater than ten years ago. She was initially being evaluated at an outside clinic but could not continue evaluation due to lack of funds. Syncope is often preceded by lightheadedness but can also be sudden. Is not entirely sure if there are any triggers. First episode when she was dancing, other episodes when she was gardening, and another episode when she was rising from a seated position. Total of approximately 8 episodes with the last episode being last year. States she hydrates properly and does not endorse volume losses through vomiting, diarrhea, or bleeding. Not currently taking any medications. Does not endorse CP, palpitations, SOB, HA, abdominal pain, tingling, numbness, f/c/n/v, rash, swelling, GI/GU sxs, or anxiety/depression.    Outpatient Medications Prior to Visit  Medication Sig Dispense Refill  . amLODipine (NORVASC) 5 MG tablet Take 1 tablet (5 mg total) by mouth daily. 30 tablet 0  . ibuprofen (ADVIL,MOTRIN) 600 MG tablet Take 1 tablet (600 mg total) by mouth every 6 (six) hours. 30 tablet 0  . Prenatal Vit-Fe Fumarate-FA (PRENATAL MULTIVITAMIN) TABS tablet Take 1 tablet by mouth daily at 12 noon.    . senna-docusate (SENOKOT-S) 8.6-50 MG tablet Take 1 tablet by mouth at bedtime as needed for mild constipation. 30 tablet 0   No facility-administered medications prior to visit.      ROS Review of Systems  Constitutional: Negative for chills, fever and malaise/fatigue.  Eyes: Negative for blurred vision.  Respiratory: Negative for shortness of breath.   Cardiovascular: Negative for chest pain and palpitations.  Gastrointestinal: Negative for abdominal pain and nausea.   Genitourinary: Negative for dysuria and hematuria.  Musculoskeletal: Negative for joint pain and myalgias.  Skin: Negative for rash.  Neurological: Negative for tingling and headaches.  Psychiatric/Behavioral: Negative for depression. The patient is not nervous/anxious.     Objective:  BP 114/77 (BP Location: Left Arm, Patient Position: Sitting, Cuff Size: Normal)   Pulse 74   Temp 98.2 F (36.8 C) (Oral)   Ht 5\' 2"  (1.575 m)   Wt 152 lb (68.9 kg)   LMP 05/30/2018 (Approximate)   SpO2 99%   Breastfeeding? No   BMI 27.80 kg/m   BP/Weight 06/19/2018 05/19/2017 02/15/9701  Systolic BP 637 858 850  Diastolic BP 77 77 73  Wt. (Lbs) 152 - -  BMI 27.8 - -      Physical Exam  Constitutional: She is oriented to person, place, and time.  Well developed, well nourished, NAD, polite  HENT:  Head: Normocephalic and atraumatic.  Eyes: No scleral icterus.  Neck: Normal range of motion. Neck supple. No thyromegaly present.  Cardiovascular: Normal rate, regular rhythm and normal heart sounds.  Pulmonary/Chest: Effort normal and breath sounds normal.  Abdominal: Soft. Bowel sounds are normal. There is no tenderness.  Musculoskeletal: She exhibits no edema.  Neurological: She is alert and oriented to person, place, and time.  Skin: Skin is warm and dry. No rash noted. No erythema. No pallor.  Psychiatric: She has a normal mood and affect. Her behavior is normal. Thought content normal.  Vitals reviewed.    Assessment & Plan:    1. Syncope and collapse - EKG 12-Lead NSR in clinic today - Orthostatic vital signs normal -  Comprehensive metabolic panel - TSH - CBC with Differential  2. Screening for diabetes mellitus - HgB A1c 5.8%  3. Prediabetes - Pt given educational information on lower carbohydrate diet and also counseled on exercise.    Follow-up: Return in about 1 month (around 07/17/2018) for syncope and f/u [facility].   Clent Demark PA

## 2018-06-19 NOTE — Patient Instructions (Addendum)
Sncope Syncope Un sncope es la prdida temporal del conocimiento. Tambin se lo Firefighter. La causa del sncope es una disminucin sbita del flujo de sangre al cerebro. Aunque la [employer] de las causas no implican un peligro, el sncope puede ser un signo de un problema mdico grave. Los signos de que alguien est por [employer] incluyen lo siguiente:  [employer] o aturdido.  Sentir nuseas.  Ver todo blanco o negro en el campo de visin.  Tener la piel fra y hmeda.  Si se desmay, obtenga ayuda de inmediato.Llame a su servicio de emergencias local (911 en los EE.[place].). No conduzca por sus propios medios [facility] hospital. Siga estas instrucciones en su casa: Est atento a cualquier cambio en los sntomas. Tome estas medidas para controlar su afeccin:  Pdale a alguien que se quede con usted hasta que se sienta estable.  No conduzca vehculos, no use maquinarias ni practique deportes hasta que el mdico lo autorice.  Concurra a todas las visitas de control como se lo haya indicado el mdico. Esto es importante.  Si comienza a sentir que [employer], recustese de inmediato y alce (eleve) los pies por encima del nivel del corazn. Respire profundamente y de [place] continua. Espere hasta que los sntomas hayan desaparecido.  Beba suficiente lquido para Consulting civil engineer orina clara o de color amarillo plido.  Si est tomando medicamentos para la presin arterial o para el corazn, pngase de pie lentamente, tmese algunos minutos para permanecer sentado y luego prese. Esto reduce los mareos.  Tome los medicamentos de venta libre y los recetados solamente como se lo haya indicado el mdico.  Solicite ayuda de inmediato si:  Tiene un dolor de cabeza intenso.  Siente un dolor fuera de lo normal en el pecho, el abdomen o la espalda.  Sangra por la boca o el recto, o sus heces son de color negro o aspecto alquitranado.  Tiene latidos cardacos irregulares  o muy rpidos (palpitaciones).  Siente dolor al respirar.  Se desmaya una o varias veces.  Tiene una convulsin.  Se siente confundido.  Presenta dificultad para caminar.  Siente debilidad intensa.  Tiene problemas de visin. Estos sntomas pueden representar un problema grave que constituye Engineering geologist. No espere hasta que los sntomas desaparezcan. Solicite atencin mdica de inmediato. Llame a su servicio de Therapist, sports (911 en los [place]). No conduzca por sus propios medios [employer] Financial. Esta informacin no tiene Marine scientist el consejo del mdico. Asegrese de hacerle al mdico cualquier pregunta que tenga. Document Released: 08/31/2005 Document Revised: 04/04/2017 Document Reviewed: 08/05/2015 Elsevier Interactive Patient Educ   Plan de alimentacin para la prediabetes (Prediabetes Eating Plan) La prediabetes, tambin llamada intolerancia a la glucosa o alteracin de la glucosa en ayunas, es una afeccin que eleva los niveles de azcar en la sangre (glucemia) por encima de lo normal. Seguir una dieta saludable puede ayudar a mantener la prediabetes bajo control, y tambin reduce el riesgo de tener diabetes tipo2 y cardiopata, que es ms alto en las personas que tienen esta afeccin. Junto con la actividad fsica habitual, una dieta saludable:  Promueve la prdida de [place].  Ayuda a Environmental consultant de Dispensing optician.  Ayuda a mejorar la forma en que el organismo [place] la insulina. QU DEBO SABER ACERCA DE ESTE PLAN DE [place]?  Use el ndice glucmico (IG) para planificar las comidas. El ndice le informa con qu rapidez un alimento elevar su nivel de  azcar en la sangre. Elija los alimentos con bajo IG. Estos tardan ms tiempo en subir el nivel de azcar en la sangre.  Preste mucha atencin a la cantidad de hidratos de carbono que hay en los alimentos que consume. Los hidratos de carbono [employer] niveles de Air traffic controller.  Lleve un registro de la cantidad de caloras que ingiere. Ingerir la cantidad correcta de caloras lo ayudar a Development worker, international aid peso saludable. Bajar alrededor del 7por ciento del peso inicial puede ayudar a Product/process development scientist la diabetes tipo2.  Tal vez deba seguir Web designer. Esta incluye una gran cantidad de verduras, carnes magras o pescado, cereales integrales, frutas, as como aceites y grasas saludables.  QU ALIMENTOS PUEDO COMER? Cereales Cereales integrales, como panes, galletas, cereales y pastas de salvado o integrales. Avena sin azcar. Trigo burgol. Cebada. Quinua. Arroz integral. Tortillas o tacos de harina de maz o de salvado. [place] Valeda Malm. Espinaca. Guisantes. Remolachas. Coliflor. Repollo. Brcoli. Zanahorias. Tomates. Cal[place]aza. Augustin Coupe. Hierbas. Pimientos. Cebollas. Pepinos. Repollitos de Bruselas. Frutas Frutos rojos. Bananas. Manzanas. Naranjas. Uvas. Papaya. Mango. Ector. Kiwi. Pomelo. Cerezas. Carnes y otras fuentes de protenas Mariscos. Carnes [place], entre ellas, pollo y [place] o cortes magros de carne de cerdo y de [place]. Tofu. Huevos. Los frutos secos. Frijoles. Lcteos Productos lcteos descremados o semidescremados, como yogur, queso cottage y [place]. [employer]. T. Caf. Gaseosas sin azcar o dietticas. Agua de [place]. Leche. Productos alternativos [place], como leche de soja o de [place]. Condimentos Mostaza. Salsa de pepinillos. [place] con bajo contenido de [place] y de Location manager. Salsa barbacoa con bajo contenido de grasa y de azcar. Mayonesa sin grasa o con bajo contenido de [place]. Dulces y postres [place] sin azcar o con bajo contenido de [place]. Helados y otros dulces congelados sin azcar o con bajo contenido de [place]. Grasas y Medical laboratory scientific officer. Nueces. Aceite de oliva. Los artculos mencionados arriba pueden no ser [employer] de las bebidas o los alimentos recomendados. Comunquese con el nutricionista para conocer ms  opciones. QU ALIMENTOS NO SE RECOMIENDAN? Cereales Productos a base de [place] y de [place], como panes, pastas, bocadillos y cereales. Bebidas Bebidas azucaradas, como t helado y gaseosas con Location manager. Dulces y postres Productos de [place], [place] tortas, [place], [place], Museum/gallery exhibitions officer y tarta de [place]. Los artculos mencionados arriba pueden no ser [employer] de las bebidas y los alimentos que se Higher education careers adviser. Comunquese con el nutricionista para obtener ms informacin. Esta informacin no tiene Marine scientist el consejo del mdico. Asegrese de hacerle al mdico cualquier pregunta que tenga. Document Released: 08/12/2015 Document Revised: 08/12/2015 Document Reviewed: 12/17/2014 Elsevier Interactive Patient Education  2017 [employer].  ation  2018 [employer].

## 2018-06-20 LAB — CBC WITH DIFFERENTIAL/PLATELET
Basophils Absolute: 0 10*3/uL (ref 0.0–0.2)
Basos: 0 %
EOS (ABSOLUTE): 0.1 10*3/uL (ref 0.0–0.4)
EOS: 1 %
Hematocrit: 38.5 % (ref 34.0–46.6)
Hemoglobin: 12.8 g/dL (ref 11.1–15.9)
IMMATURE GRANS (ABS): 0 10*3/uL (ref 0.0–0.1)
Immature Granulocytes: 0 %
LYMPHS: 48 %
Lymphocytes Absolute: 3.7 10*3/uL — ABNORMAL HIGH (ref 0.7–3.1)
MCH: 30.5 pg (ref 26.6–33.0)
MCHC: 33.2 g/dL (ref 31.5–35.7)
MCV: 92 fL (ref 79–97)
MONOCYTES: 5 %
Monocytes Absolute: 0.4 10*3/uL (ref 0.1–0.9)
NEUTROS PCT: 46 %
Neutrophils Absolute: 3.6 10*3/uL (ref 1.4–7.0)
PLATELETS: 366 10*3/uL (ref 150–450)
RBC: 4.2 x10E6/uL (ref 3.77–5.28)
RDW: 14.5 % (ref 12.3–15.4)
WBC: 7.9 10*3/uL (ref 3.4–10.8)

## 2018-06-20 LAB — COMPREHENSIVE METABOLIC PANEL
ALT: 8 IU/L (ref 0–32)
AST: 9 IU/L (ref 0–40)
Albumin/Globulin Ratio: 1.4 (ref 1.2–2.2)
Albumin: 4.2 g/dL (ref 3.5–5.5)
Alkaline Phosphatase: 77 IU/L (ref 39–117)
BUN/Creatinine Ratio: 13 (ref 9–23)
BUN: 9 mg/dL (ref 6–20)
Bilirubin Total: 0.3 mg/dL (ref 0.0–1.2)
CALCIUM: 9.2 mg/dL (ref 8.7–10.2)
CO2: 20 mmol/L (ref 20–29)
CREATININE: 0.7 mg/dL (ref 0.57–1.00)
Chloride: 102 mmol/L (ref 96–106)
GFR calc Af Amer: 131 mL/min/{1.73_m2} (ref >59)
GFR, EST NON AFRICAN AMERICAN: 113 mL/min/{1.73_m2} (ref >59)
GLOBULIN, TOTAL: 3 g/dL (ref 1.5–4.5)
Glucose: 84 mg/dL (ref 65–99)
Potassium: 4 mmol/L (ref 3.5–5.2)
Sodium: 137 mmol/L (ref 134–144)
TOTAL PROTEIN: 7.2 g/dL (ref 6.0–8.5)

## 2018-06-20 LAB — TSH: TSH: 1.88 u[IU]/mL (ref 0.450–4.500)

## 2018-06-22 ENCOUNTER — Telehealth (INDEPENDENT_AMBULATORY_CARE_PROVIDER_SITE_OTHER): Payer: Self-pay

## 2018-06-22 NOTE — Telephone Encounter (Signed)
-----   Message from Clent Demark, PA-C sent at 06/20/2018  9:16 AM EDT ----- Labs are normal

## 2018-06-22 NOTE — Telephone Encounter (Signed)
Call placed using pacific interpreter Nevin Bloodgood [phone]) patient aware of normal labs. Nat Christen, CMA

## 2018-06-25 ENCOUNTER — Ambulatory Visit: Payer: Self-pay | Attending: Physician Assistant

## 2018-07-18 ENCOUNTER — Ambulatory Visit (INDEPENDENT_AMBULATORY_CARE_PROVIDER_SITE_OTHER): Payer: Self-pay | Admitting: Physician Assistant

## 2018-07-18 ENCOUNTER — Encounter (INDEPENDENT_AMBULATORY_CARE_PROVIDER_SITE_OTHER): Payer: Self-pay | Admitting: Physician Assistant

## 2018-07-18 ENCOUNTER — Other Ambulatory Visit: Payer: Self-pay

## 2018-07-18 VITALS — BP 102/67 | HR 75 | Temp 98.3°F | Ht 62.0 in | Wt 150.2 lb

## 2018-07-18 DIAGNOSIS — R7303 Prediabetes: Secondary | ICD-10-CM | POA: Insufficient documentation

## 2018-07-18 DIAGNOSIS — R000 Tachycardia, unspecified: Secondary | ICD-10-CM

## 2018-07-18 DIAGNOSIS — R61 Generalized hyperhidrosis: Secondary | ICD-10-CM

## 2018-07-18 DIAGNOSIS — R55 Syncope and collapse: Secondary | ICD-10-CM

## 2018-07-18 NOTE — Patient Instructions (Signed)
Feocromocitoma Pheochromocytoma Un feocromocitoma es un tumor poco frecuente que se forma a partir de ciertas clulas que producen y liberan hormonas (clulas neuroendocrinas). Este tipo de tumor se encuentra en el centro de las glndulas suprarrenales, ubicadas encima de los riones. Un feocromocitoma genera cantidades excesivas de hormonas, lo que puede causar sntomas como hipertensin arterial y frecuencia cardaca acelerada. La mayora de los feocromocitomas no son cancerosos (son benignos). Algunos son cancerosos Programme researcher, broadcasting/film/video) y pueden extenderse a otras partes del cuerpo (metstasis). Cules son las causas? Se desconoce la causa de esta afeccin. Qu incrementa el riesgo? Los siguientes factores pueden hacer que usted sea ms propenso a tener esta enfermedad:  Tener entre 8 y 82aos.  Ser varn.  Tener ciertos sndromes hereditarios (genticos), como neoplasia endocrina mltiple de tipo1 o 2.  Tener un sistema inmunitario debilitado debido a lo siguiente: ? Medicamentos (inmunodepresores). ? Infeccin por VIH (virus de inmunodeficiencia humana).  Cules son los signos o los sntomas? Ciertos medicamentos y alimentos pueden provocar la aparicin de sntomas. Otros factores que pueden causar sntomas (desencadenar una crisis o episodio) pueden incluir el esfuerzo fsico, el estrs, la anestesia y cambios en la posicin del cuerpo. Los sntomas tambin pueden aparecer sin que haya factores desencadenantes. Los sntomas de esta afeccin incluyen lo siguiente:  Hipertensin arterial.  Falta de aire.  Dolores de [place].  Cristy Hilts.  Nuseas o vmitos.  Ataques de ansiedad o crisis de [place].  Latidos cardacos rpidos o irregulares (palpitaciones).  Piel plida o hmeda.  Sudoracin abundante.  En algunos casos no hay sntomas. Cmo se diagnostica? Esta afeccin se puede diagnosticar en funcin de lo siguiente:  Un examen fsico y los antecedentes mdicos.  Anlisis  de [place].  Anlisis de [place].  Pruebas de diagnstico por imgenes, por ejemplo: ? Exploracin por tomografa computarizada (TC). ? Resonancia magntica (RM). ? Radiografa. ? Tomografa por emisin de positrones (TEP). ? Exploracin con metayodobencilguanidina (MIBG) u octreotida. Estos estudios emplean sustancias radiactivas para detectar ciertos tumores.  Si le Nurse, learning disability, es posible que deba realizarse otras pruebas para determinar si tiene un sndrome gentico que pueda aumentar su riesgo de Air traffic controller. Si el tumor es canceroso, como parte del diagnstico, tambin se Engineering geologist se extendi a Airline pilot del cuerpo (en qu estadio se encuentra). Cmo se trata? El tratamiento de esta afeccin depende de si el tumor es canceroso y del estadio del cncer. Entre las opciones de tratamiento se incluyen las siguientes:  Medicamentos para Engineer, materials presin arterial antes de la [place].  Ciruga para extirpar el tumor y el tejido circundante.  Introduccin de un tipo de yodo radiactivo en el cuerpo a travs de un tubo (catter) intravenoso (tratamiento con yodo radiactivo). Esta sustancia encuentra las clulas cancerosas y las elimina emitiendo radiacin.  Uso de radiacin o rayos X de alta potencia para eliminar las clulas cancerosas o retrasar su crecimiento (radioterapia).  Empleo de medicamentos: ? Para eliminar las clulas cancerosas o retrasar su crecimiento (quimioterapia). ? Para atacar los genes y las protenas de un tumor (tratamiento dirigido). Estos medicamentos atacan los genes y las protenas que permiten que un tumor crezca, al mismo tiempo que [employer] daos a las clulas sanas.  Obstruccin de la circulacin de sangre al tumor (terapia de embolizacin). Esto ayuda a [place] clulas cancerosas.  Empleo de ondas de radio para destruir las clulas cancerosas (ablacin por radiofrecuencia).  Destruccin de  las clulas cancerosas por congelamiento (crioablacin).  Siga estas  indicaciones en su casa:  Busque informacin sobre la enfermedad y los posibles efectos secundarios del [place]. Esto le ser til a la hora de discutir las alternativas de tratamiento con el mdico y a Economist el ms adecuado para usted.  Tome los medicamentos de venta libre y los recetados solamente como se lo haya indicado el mdico.  Retome sus actividades normales como se lo haya indicado el mdico. Pregntele al mdico qu actividades son seguras para usted.  Consuma una dieta saludable. Una dieta saludable incluye muchas frutas y verduras, productos lcteos descremados, carnes magras y [place]. ? Asegrese de que la mitad del plato est ocupada por frutas o verduras. ? Elija alimentos con alto contenido de [place], como panes y cereales integrales.  Limite el consumo de alcohol a no ms de 1 medida por da si es mujer y no est Music therapist, y a 2 medidas por da si es hombre. Una medida equivale a 12oz (353m) de cerveza, 5oz (1473m de vino o 1oz (4419mde bebidas alcohlicas de alta graduacin.  Realice actividad fsica con regularidad. Pngase como objetivo hacer 31m92mos de actiSamoaintensidad moderada 5veces a la semana. Por ejemplo, caminar o practicar yoga. Es importante que hable con el mdico antes de comenzar una rutina nueva de ejercicios.  Concurra a todas las visitas de control como se lo haya indicado el mdico. Esto es importante. Dnde encontrar ms informacin:  InstOffice managertiGreen Campww.cancer.gov  Sociedad EstaScientist, clinical (histocompatibility and immunogenetics)erFort Jesupww.cancer.org  Sociedad Estadounidense de OncoBrewing technologisterBurlington Northern Santa FeClinical Oncology): www.cancer.net Comunquese con un mdico si:  Tiene nuseas o dolor abdominal.  Tiene diarrea o nota algn cambio en sus deposiciones.  Tiene hinchazn o enrojecimiento en  cualquier parte, especialmente alrededor de un corte o una herida.  Siente dolor de cabeza o dolor sinusal (dolor en los senos paranasales).  Tiene llagas en la piel o le aparece una erupcin cutnea.  Siente dolor o ardor al orinGarment/textile technologistaja de peso sin causa aparente. Solicite ayuda de inmediato si:  Siente falta de aire o dolor en el pecho.  Tiene un dolor de cabeza intenso.  Tiene fiebre.  Observa sangre en la orina.  Se siente confundido.  Presenta dolor o hinchazn en los brazos o en las piernas.  No puede controlar los vmitos. Resumen  Un feocromocitoma es un tumor que se forma a partir de ciertas clulas que producen y liberan hormonas (clulas neuroendocrinas). Este tipo de tumor se encuentra en el centro de las glndulas suprarrenales, ubicadas encima de los riones.  Un feocromocitoma genera cantidades excesivas de hormonas, lo que puede causar sntomas como hipertensin arterial y frecuencia cardaca acelerada.  Las personas con ciertos sndromes hereditarios (genticos) tienen mayoNordstrombabilidades de padeManufacturing engineerermedad.  El tratamiento del feocromocitoma depende de si el tumor es canceroso y si se ha extendido a otras partes del cuerpo. Esta informacin no tiene comoMarine scientistconsejo del mdico. Asegrese de hacerle al mdico cualquier pregunta que tenga. Document Released: 01/31/2017 Document Revised: 01/31/2017 Elsevier Interactive Patient Education  2018[employer]

## 2018-07-18 NOTE — Progress Notes (Signed)
   Subjective:  Patient ID: Cassandra Turner, female    DOB: Oct 25, 1986  Age: 32 y.o. MRN: [id number]  CC: f/u syncope  HPI Cassandra Turner is a 32 y.o. female with a medical history of prediabetes presents to f/u on syncope and collapse. Says she has not had an episode of syncope since the last visit but has been feeling light-headed. Says light headedness is frequent but is unable to quantitate how many episodes per week. Associated symptoms include diaphoresis, shortness of breath, nausea, tachycardia, and "hot and cold" at the same time. Pt says symptoms can occur while participating in activities as well as when she is a rest. Does not endorse HA, CP, abdominal pain, edema, rash, or GI/GU sxs.      ROS Review of Systems  Constitutional: Negative for chills, fever and malaise/fatigue.  Eyes: Negative for blurred vision.  Respiratory: Positive for shortness of breath.   Cardiovascular: Negative for chest pain and palpitations.       Tachycardia  Gastrointestinal: Positive for nausea. Negative for abdominal pain.  Genitourinary: Negative for dysuria and hematuria.  Musculoskeletal: Negative for joint pain and myalgias.  Skin: Negative for rash.  Neurological: Positive for dizziness. Negative for tingling and headaches.  Psychiatric/Behavioral: Negative for depression. The patient is not nervous/anxious.     Objective:  BP 102/67 (BP Location: Left Arm, Patient Position: Sitting, Cuff Size: Normal)   Pulse 75   Temp 98.3 F (36.8 C) (Oral)   Ht 5\' 2"  (1.575 m)   Wt 150 lb 3.2 oz (68.1 kg)   LMP 06/27/2018 (Approximate)   SpO2 97%   Breastfeeding? No   BMI 27.47 kg/m   BP/Weight 07/18/2018 06/19/2018 1/75/1025  Systolic BP 852 778 242  Diastolic BP 67 77 77  Wt. (Lbs) 150.2 152 -  BMI 27.47 27.8 -      Physical Exam  Constitutional: She is oriented to person, place, and time.  Well developed, well nourished, NAD, polite  HENT:  Head: Normocephalic and  atraumatic.  Eyes: No scleral icterus.  Neck: Normal range of motion. Neck supple. No thyromegaly present.  Cardiovascular: Normal rate, regular rhythm and normal heart sounds.  No LE edema bilaterally  Pulmonary/Chest: Effort normal and breath sounds normal.  Musculoskeletal: She exhibits no edema.  Neurological: She is alert and oriented to person, place, and time.  Skin: Skin is warm and dry. No rash noted. No erythema. No pallor.  Psychiatric: She has a normal mood and affect. Her behavior is normal. Thought content normal.  Vitals reviewed.    Assessment & Plan:   1. Syncope and collapse - 24 hr urinary catcholamines and metanephrines, fractionated - Will send to endocrinology vs cardiology  based on urine results - CBC, TSH, CMP, and A1c normal last month - Orthostatics and EKG normal last month  2. Diaphoresis - 24 hr urinary catcholamines and metanephrines, fractionated - Will send to endocrinology vs cardiology  based on urine results  3. Tachycardia - 24 hr urinary catcholamines and metanephrines, fractionated - Will send to endocrinology vs cardiology  based on urine results    Follow-up: Return in about 2 weeks (around 08/01/2018).   Clent Demark PA

## 2018-07-23 ENCOUNTER — Other Ambulatory Visit (INDEPENDENT_AMBULATORY_CARE_PROVIDER_SITE_OTHER): Payer: Self-pay

## 2018-07-23 ENCOUNTER — Other Ambulatory Visit (INDEPENDENT_AMBULATORY_CARE_PROVIDER_SITE_OTHER): Payer: Self-pay | Admitting: Physician Assistant

## 2018-07-23 DIAGNOSIS — R61 Generalized hyperhidrosis: Secondary | ICD-10-CM

## 2018-07-23 DIAGNOSIS — R55 Syncope and collapse: Secondary | ICD-10-CM

## 2018-07-23 DIAGNOSIS — R000 Tachycardia, unspecified: Secondary | ICD-10-CM

## 2018-07-23 DIAGNOSIS — O24419 Gestational diabetes mellitus in pregnancy, unspecified control: Secondary | ICD-10-CM

## 2018-07-26 LAB — CATECHOLAMINES, FRACTIONATED, URINE, 24 HOUR
DOPAMINE RANDOM UR: 77 ug/L
Dopamine , 24H Ur: 204 ug/24 hr (ref 0–510)
Epinephrine, Rand Ur: <1 ug/L
NOREPINEPH RAND UR: 6 ug/L
NOREPINEPHRINE 24H UR: 16 ug/(24.h) (ref 0–135)

## 2018-07-26 LAB — METANEPHRINES, URINE, 24 HOUR
Metaneph Total, Ur: 23 ug/L
Metanephrines, 24H Ur: 61 ug/24 hr (ref 45–290)
Normetanephrine, 24H Ur: 148 ug/24 hr (ref 82–500)
Normetanephrine, Ur: 56 ug/L

## 2018-07-27 ENCOUNTER — Telehealth (INDEPENDENT_AMBULATORY_CARE_PROVIDER_SITE_OTHER): Payer: Self-pay

## 2018-07-27 NOTE — Telephone Encounter (Signed)
-----   Message from Clent Demark, PA-C sent at 07/27/2018  8:34 AM EDT ----- Results are normal. Does not seem to have a tumor of the adrenals that is causing her symptoms. I can work up further and refer if necessary when I see her again.

## 2018-07-27 NOTE — Telephone Encounter (Signed)
Call placed using pacific interpreter [phone]) patient aware that results are normal. No tumor of the adrenals that is causing your symptoms. Further testing and referral can be done at next OV if necessary. Nat Christen, CMA

## 2018-08-01 ENCOUNTER — Ambulatory Visit (INDEPENDENT_AMBULATORY_CARE_PROVIDER_SITE_OTHER): Payer: Self-pay | Admitting: Physician Assistant

## 2018-08-02 ENCOUNTER — Encounter (INDEPENDENT_AMBULATORY_CARE_PROVIDER_SITE_OTHER): Payer: Self-pay | Admitting: Physician Assistant

## 2018-08-02 ENCOUNTER — Ambulatory Visit (INDEPENDENT_AMBULATORY_CARE_PROVIDER_SITE_OTHER): Payer: Self-pay | Admitting: Physician Assistant

## 2018-08-02 ENCOUNTER — Other Ambulatory Visit: Payer: Self-pay

## 2018-08-02 VITALS — BP 122/74 | HR 71 | Temp 98.1°F | Ht 62.0 in | Wt 149.0 lb

## 2018-08-02 DIAGNOSIS — R55 Syncope and collapse: Secondary | ICD-10-CM

## 2018-08-02 NOTE — Progress Notes (Signed)
   Subjective:  Patient ID: Cassandra Turner, female    DOB: 07/02/1986  Age: 32 y.o. MRN: [id number]  CC: f/u diaphoresis, syncope, and tachycardia  HPI Cassandra Turner a 32 y.o.femalewith a medical history of prediabetes presents to f/u on syncope and collapse. Says she has not had an episode of syncope since the last visit but has had and episode of light-headedness. Previous pre-syncopal symptoms included diaphoresis, shortness of breath, nausea, tachycardia, and "hot and cold" at the same time. Pt says symptoms can occur while participating in activities as well as when she is a rest. Does not endorse HA, CP, abdominal pain, nausea, vomiting,  edema, rash, or GI/GU sxs. Work up thus far has been negative/normal for TSH, CBC, CMP, A1c, Metanephrines, and catecholamines. EKG last month normal.   *Pt has been approved at 100% for CAFA.     ROS Review of Systems  Constitutional: Negative for chills, fever and malaise/fatigue.  Eyes: Negative for blurred vision.  Respiratory: Negative for shortness of breath.   Cardiovascular: Negative for chest pain and palpitations.  Gastrointestinal: Negative for abdominal pain and nausea.  Genitourinary: Negative for dysuria and hematuria.  Musculoskeletal: Negative for joint pain and myalgias.  Skin: Negative for rash.  Neurological: Negative for tingling and headaches.  Psychiatric/Behavioral: Negative for depression. The patient is not nervous/anxious.     Objective:  BP 122/74 (BP Location: Left Arm, Patient Position: Sitting, Cuff Size: Normal)   Pulse 71   Temp 98.1 F (36.7 C) (Oral)   Ht 5\' 2"  (1.575 m)   Wt 149 lb (67.6 kg)   LMP 07/29/2018 (Exact Date)   SpO2 98%   Breastfeeding? No   BMI 27.25 kg/m   BP/Weight 08/02/2018 07/18/2018 03/13/8118  Systolic BP 147 829 562  Diastolic BP 74 67 77  Wt. (Lbs) 149 150.2 152  BMI 27.25 27.47 27.8      Physical Exam  Constitutional: She is oriented to person,  place, and time.  Well developed, well nourished, NAD, polite  HENT:  Head: Normocephalic and atraumatic.  Eyes: No scleral icterus.  Neck: Normal range of motion. Neck supple. No thyromegaly present.  Cardiovascular: Normal rate, regular rhythm and normal heart sounds.  No LE edema bilaterally  Pulmonary/Chest: Effort normal and breath sounds normal.  Abdominal: Soft. Bowel sounds are normal. There is no tenderness.  Musculoskeletal: She exhibits no edema.  Neurological: She is alert and oriented to person, place, and time.  Skin: Skin is warm and dry. No rash noted. No erythema. No pallor.  Psychiatric: She has a normal mood and affect. Her behavior is normal. Thought content normal.  Vitals reviewed.    Assessment & Plan:   1. Syncope and collapse - CT Head Wo Contrast; Future - Ambulatory referral to Neurology - Ambulatory referral to Cardiology - Sedimentation Rate - C-reactive protein - ANA w/Reflex - Hepatitis panel, acute    Follow-up: Return in about 8 weeks (around 09/27/2018) for Syncope. See me after neurology and cardiology evaluation.   Clent Demark PA

## 2018-08-02 NOTE — Patient Instructions (Signed)
Sncope Syncope Un sncope es la prdida temporal del conocimiento. Tambin se lo Firefighter. La causa del sncope es una disminucin sbita del flujo de sangre al cerebro. Aunque la [employer] de las causas no implican un peligro, el sncope puede ser un signo de un problema mdico grave. Los signos de que alguien est por [employer] incluyen lo siguiente:  [employer] o aturdido.  Sentir nuseas.  Ver todo blanco o negro en el campo de visin.  Tener la piel fra y hmeda.  Si se desmay, obtenga ayuda de inmediato.Llame a su servicio de emergencias local (911 en los [place].). No conduzca por sus propios medios [facility] hospital. Siga estas instrucciones en su casa: Est atento a cualquier cambio en los sntomas. Tome estas medidas para controlar su afeccin:  Pdale a alguien que se quede con usted hasta que se sienta estable.  No conduzca vehculos, no use maquinarias ni practique deportes hasta que el mdico lo autorice.  Concurra a todas las visitas de control como se lo haya indicado el mdico. Esto es importante.  Si comienza a sentir que [employer], recustese de inmediato y alce (eleve) los pies por encima del nivel del corazn. Respire profundamente y de [place] continua. Espere hasta que los sntomas hayan desaparecido.  Beba suficiente lquido para Consulting civil engineer orina clara o de color amarillo plido.  Si est tomando medicamentos para la presin arterial o para el corazn, pngase de pie lentamente, tmese algunos minutos para permanecer sentado y luego prese. Esto reduce los mareos.  Tome los medicamentos de venta libre y los recetados solamente como se lo haya indicado el mdico.  Solicite ayuda de inmediato si:  Tiene un dolor de cabeza intenso.  Siente un dolor fuera de lo normal en el pecho, el abdomen o la espalda.  Sangra por la boca o el recto, o sus heces son de color negro o aspecto alquitranado.  Tiene latidos cardacos irregulares  o muy rpidos (palpitaciones).  Siente dolor al respirar.  Se desmaya una o varias veces.  Tiene una convulsin.  Se siente confundido.  Presenta dificultad para caminar.  Siente debilidad intensa.  Tiene problemas de visin. Estos sntomas pueden representar un problema grave que constituye Engineering geologist. No espere hasta que los sntomas desaparezcan. Solicite atencin mdica de inmediato. Llame a su servicio de Therapist, sports (911 en los [place]). No conduzca por sus propios medios [employer]. Esta informacin no tiene Marine scientist el consejo del mdico. Asegrese de hacerle al mdico cualquier pregunta que tenga. Document Released: 08/31/2005 Document Revised: 04/04/2017 Document Reviewed: 08/05/2015 Elsevier Interactive Patient Education  [employer].

## 2018-08-03 LAB — ENA+DNA/DS+SJORGEN'S
ENA RNP Ab: <0.2 AI (ref 0.0–0.9)
ENA SSB (LA) Ab: <0.2 AI (ref 0.0–0.9)
dsDNA Ab: 3 IU/mL (ref 0–9)

## 2018-08-03 LAB — HEPATITIS PANEL, ACUTE
Hep A IgM: NEGATIVE
Hep B C IgM: NEGATIVE
Hep C Virus Ab: <0.1 s/co ratio (ref 0.0–0.9)
Hepatitis B Surface Ag: NEGATIVE

## 2018-08-03 LAB — C-REACTIVE PROTEIN: CRP: 11 mg/L — ABNORMAL HIGH (ref 0–10)

## 2018-08-03 LAB — ANA W/REFLEX: Anti Nuclear Antibody(ANA): POSITIVE — AB

## 2018-08-03 LAB — SEDIMENTATION RATE: Sed Rate: 12 mm/hr (ref 0–32)

## 2018-08-09 ENCOUNTER — Ambulatory Visit (HOSPITAL_COMMUNITY): Admission: RE | Admit: 2018-08-09 | Payer: Self-pay | Source: Ambulatory Visit

## 2018-08-09 ENCOUNTER — Telehealth (INDEPENDENT_AMBULATORY_CARE_PROVIDER_SITE_OTHER): Payer: Self-pay

## 2018-08-09 NOTE — Telephone Encounter (Signed)
-----   Message from Clent Demark, PA-C sent at 08/07/2018  6:17 PM EDT ----- Negative for acute Hepatitis. Positive ANA but negative for autoimmune disease (this result can happen to 5-10% of the healthy population). I recommend to see neurologist and cardiologist.

## 2018-08-09 NOTE — Telephone Encounter (Signed)
Call placed using pacific interpreter [phone]) patient is aware that she is negative for acute hepatitis. Positive ANA but negative for autoimmune disease. Explained to patient that this results can happen to 5-10% of the healthy population. Recommended to see neurology and cardiology; referrals have been made to both and someone from that specific office will contact to schedule. Nat Christen, CMA

## 2018-08-17 ENCOUNTER — Ambulatory Visit (HOSPITAL_COMMUNITY)
Admission: RE | Admit: 2018-08-17 | Discharge: 2018-08-17 | Disposition: A | Discharge status: Home / Self Care | Payer: Self-pay | Source: Ambulatory Visit | Attending: Physician Assistant | Admitting: Physician Assistant

## 2018-08-17 DIAGNOSIS — R55 Syncope and collapse: Secondary | ICD-10-CM | POA: Insufficient documentation

## 2018-08-20 ENCOUNTER — Telehealth (INDEPENDENT_AMBULATORY_CARE_PROVIDER_SITE_OTHER): Payer: Self-pay

## 2018-08-20 NOTE — Telephone Encounter (Signed)
-----   Message from Clent Demark, PA-C sent at 08/20/2018 10:17 AM EDT ----- CT of the head is normal. Keep appointments with Cardiology and Neurology to help determine cause of syncope.

## 2018-08-20 NOTE — Telephone Encounter (Signed)
Call placed using pacific interpreter Cassandra Turner [phone]) patient is aware that CT of head is normal. Advised to keep all cardiology and neurology appointments to help determine the cause of syncope. Patient expressed understanding and did not have any additional questions. Nat Christen, CMA

## 2018-09-27 IMAGING — CT CT HEAD W/O CM
4 series · 16 of 47 positions shown, 18 images · non-contrast
Comparison: None.

CLINICAL DATA: Syncope

EXAM:
CT HEAD WITHOUT CONTRAST
TECHNIQUE: Contiguous axial images were obtained from the base of the skull
through the vertex without intravenous contrast.

[Series 3: head without · axial · non-contrast · 0.39mm/px · z∈[-116,-11]mm · 7 of 29 slices shown, 9 images]
[im 4/29  brain]
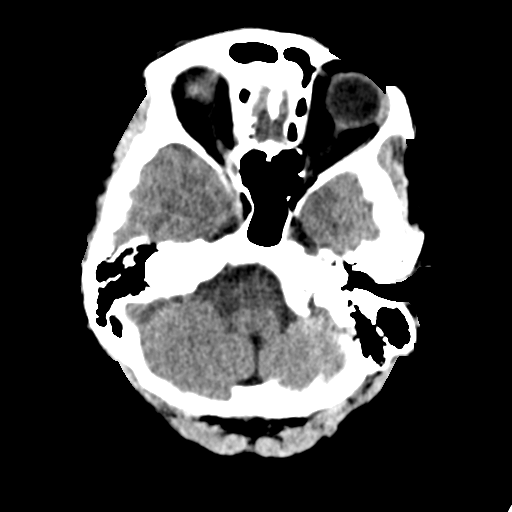
[im 4/29  bone]
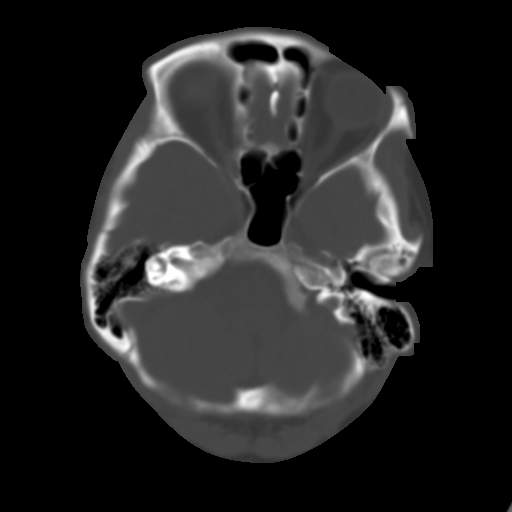
[im 8/29  brain]
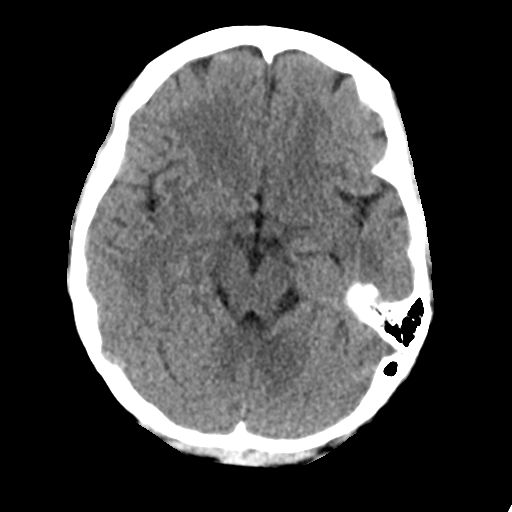
[im 11/29  brain]
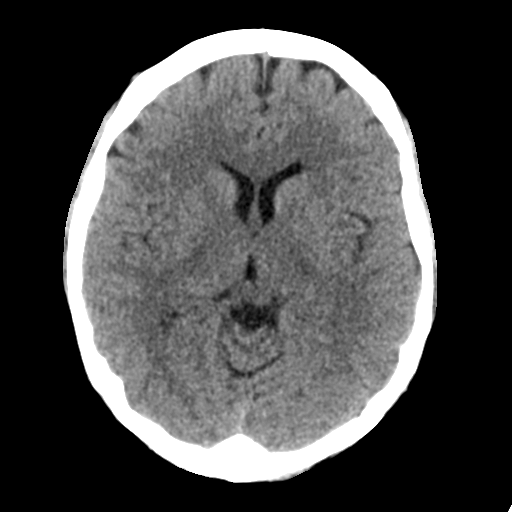
[im 15/29  brain]
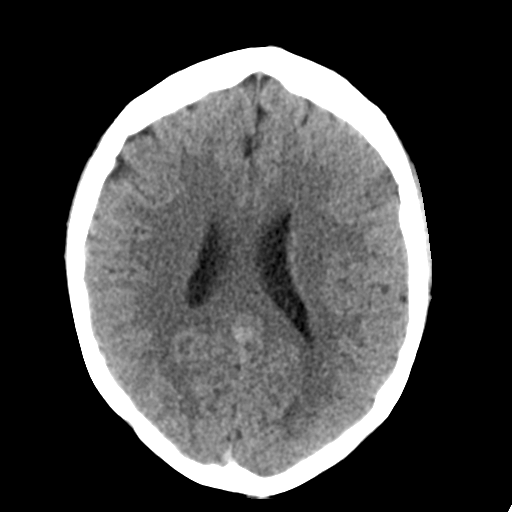
[im 18/29  brain]
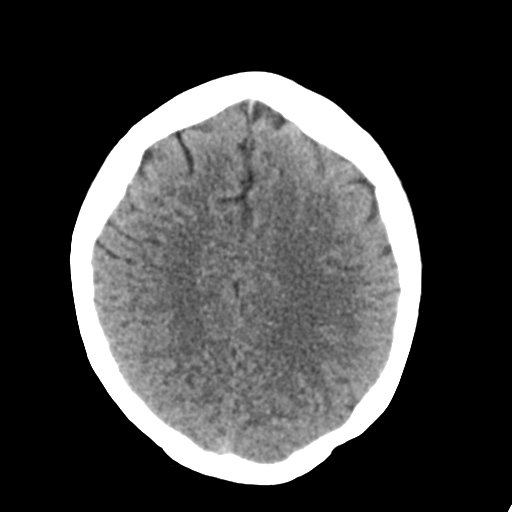
[im 18/29  bone]
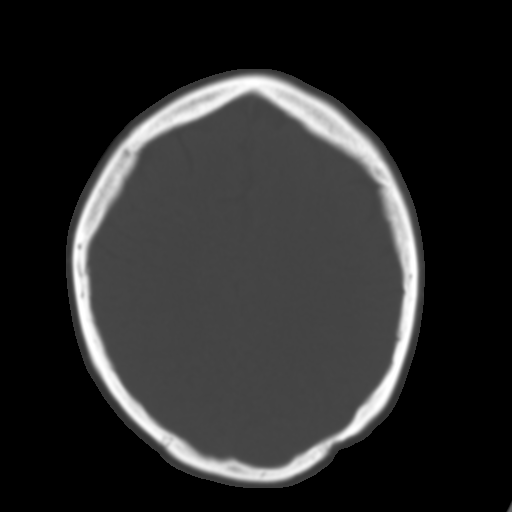
[im 22/29  brain]
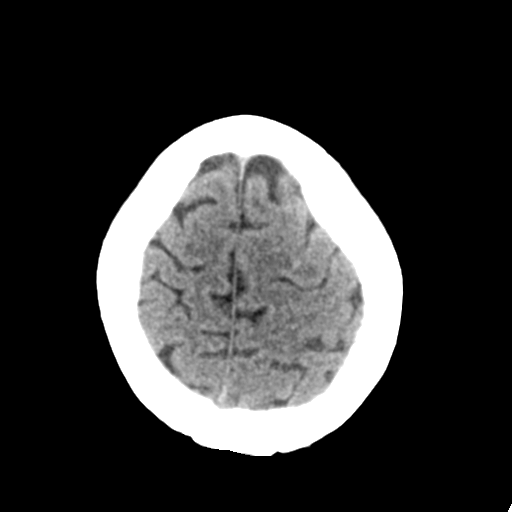
[im 25/29  brain]
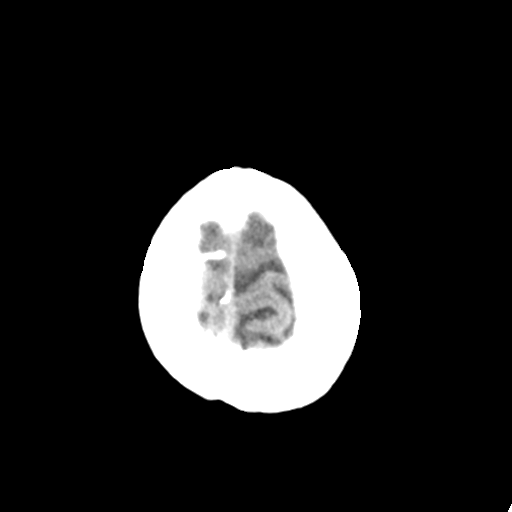

[Series 4: head bone · axial · 0.39mm/px · z∈[-117,-89]mm · 3 of 72 slices shown]
[im 8/72  bone]
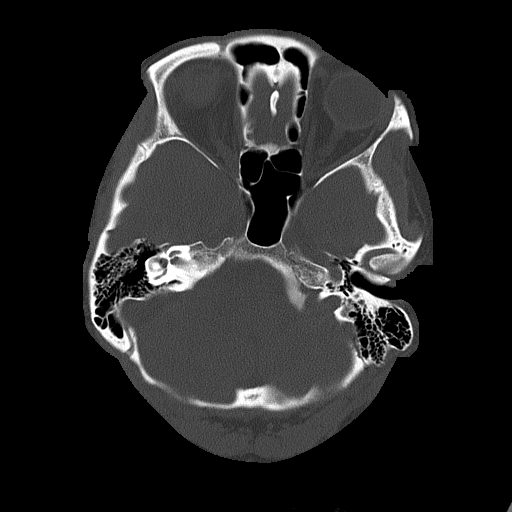
[im 15/72  bone]
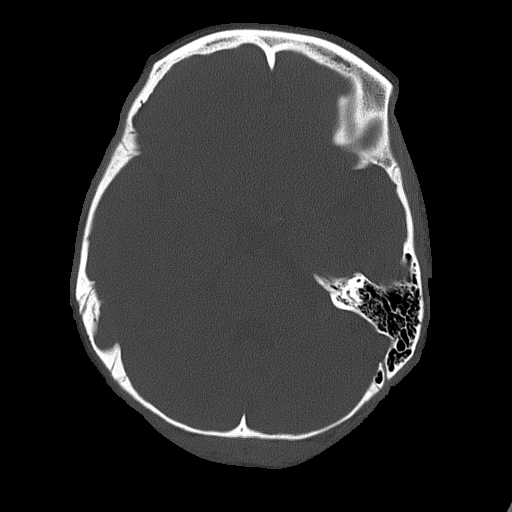
[im 22/72  bone]
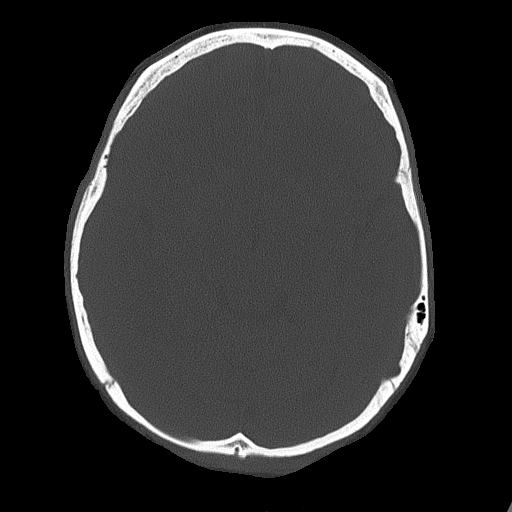

[Series 5: head without cor · coronal · non-contrast · 0.28mm/px · 3 of 66 slices shown]
[im 22/66  brain]
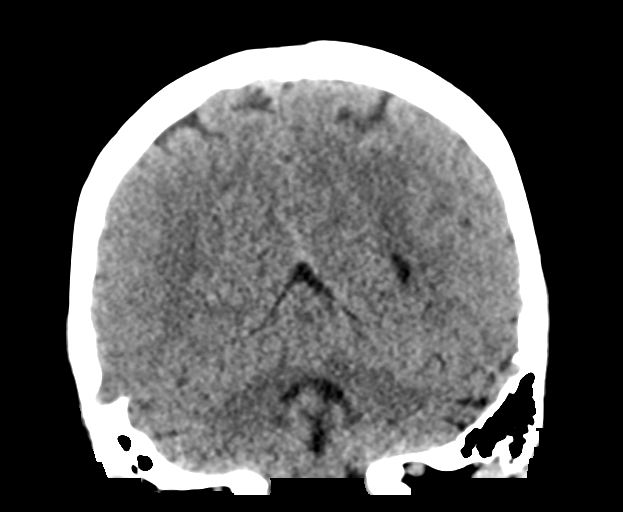
[im 29/66  brain]
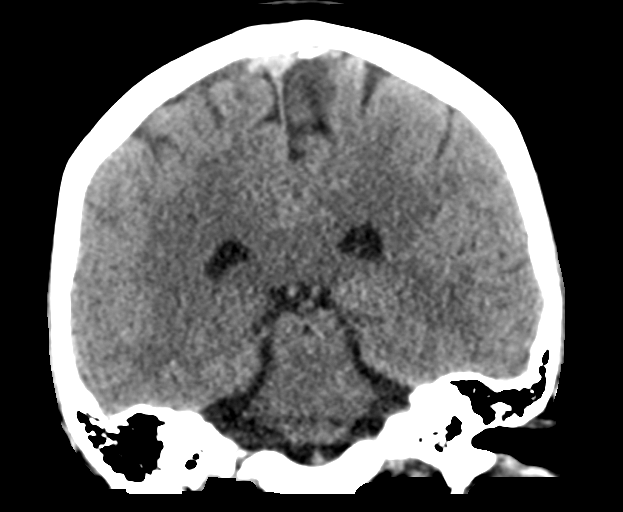
[im 37/66  brain]
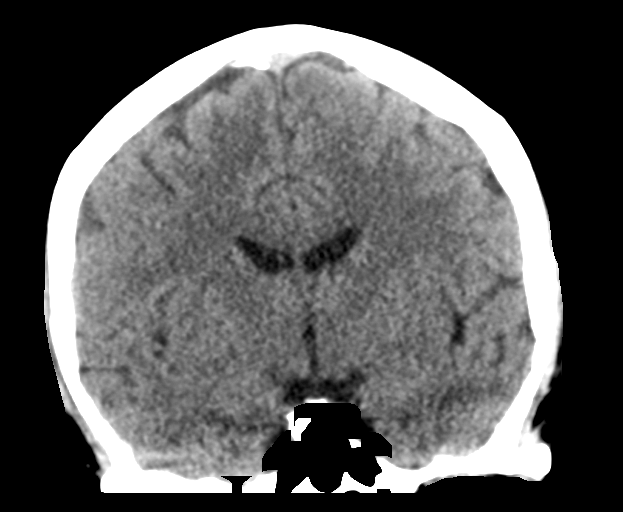

[Series 6: head without sag · sagittal · non-contrast · 0.28mm/px · 3 of 60 slices shown]
[im 20/60  brain]
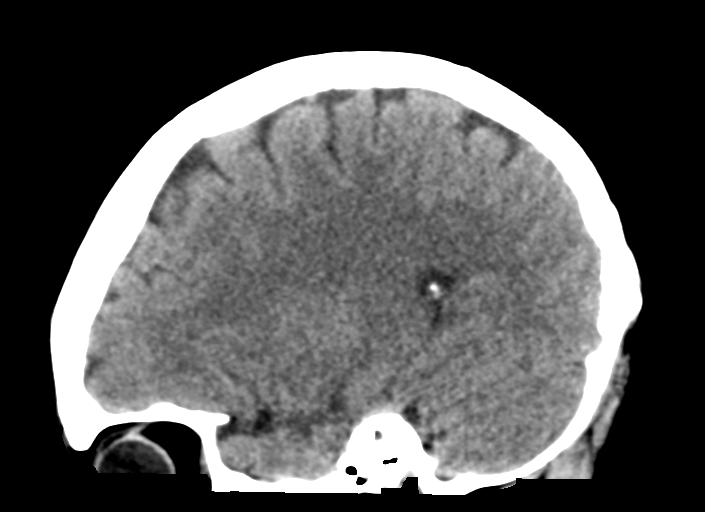
[im 30/60  brain]
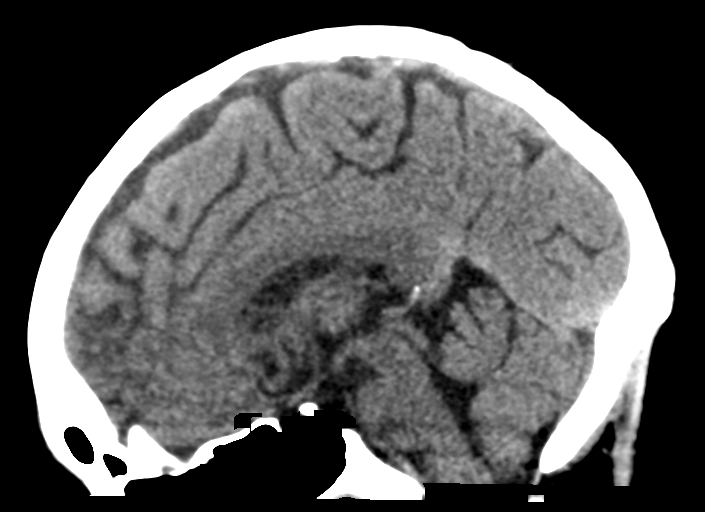
[im 40/60  brain]
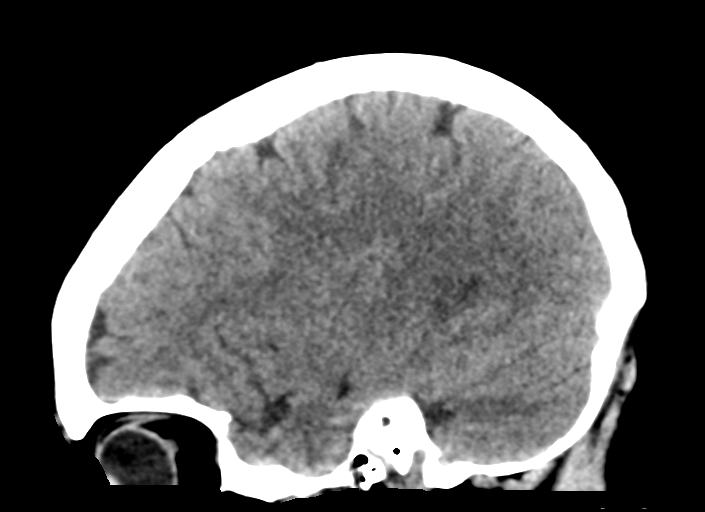

[16 of 47 positions shown; findings below may reference images not displayed]

FINDINGS: Brain: Ventricles are normal in size and configuration. There is no
intracranial mass, hemorrhage, extra-axial fluid collection, or
midline shift. Gray-white compartments appear normal. No evident
acute infarct.

Vascular: No hyperdense vessel. No appreciable vascular
calcification evident.

Skull: Bony calvarium appears intact.

Sinuses/Orbits: Visualized paranasal sinuses are clear. Visualized
orbits appear symmetric bilaterally.

Other: Visualized mastoid air cells are clear. There is debris in
the right external auditory canal.
IMPRESSION: Probable cerumen in the right external auditory canal. Study
otherwise unremarkable.

## 2018-10-02 ENCOUNTER — Ambulatory Visit (INDEPENDENT_AMBULATORY_CARE_PROVIDER_SITE_OTHER): Payer: Self-pay | Admitting: Physician Assistant

## 2018-10-02 ENCOUNTER — Other Ambulatory Visit: Payer: Self-pay

## 2018-10-02 ENCOUNTER — Encounter (INDEPENDENT_AMBULATORY_CARE_PROVIDER_SITE_OTHER): Payer: Self-pay | Admitting: Physician Assistant

## 2018-10-02 VITALS — BP 110/79 | HR 84 | Temp 98.2°F | Ht 62.0 in | Wt 151.0 lb

## 2018-10-02 DIAGNOSIS — R55 Syncope and collapse: Secondary | ICD-10-CM

## 2018-10-02 DIAGNOSIS — Z23 Encounter for immunization: Secondary | ICD-10-CM

## 2018-10-02 DIAGNOSIS — M7989 Other specified soft tissue disorders: Secondary | ICD-10-CM

## 2018-10-02 NOTE — Progress Notes (Signed)
Subjective:  Patient ID: Cassandra Turner, female    DOB: 09/10/1986  Age: 32 y.o. MRN: [id number]  CC: syncope  HPI Cassandra Turner a 32 y.o.femalewith a medical history ofprediabetespresents to f/u on syncope and collapse. Says she has not had an episode of syncope since the last visit but has had and episode of light-headedness. Previous pre-syncopal symptoms included diaphoresis, shortness of breath, nausea, tachycardia, and "hot and cold" at the same time. Pt says symptoms can occur while participating in activities as well as when she is a rest. Does not endorse HA, CP, abdominal pain, nausea, vomiting,  edema, rash, or GI/GU sxs.Work up thus far has been negative/normal for TSH, CBC, CMP, A1c, Metanephrines, and catecholamines. ANA was positive but reflex was negative or autoimmune disease. EKG normal three months ago. Has been approved for CAFA 100%. Appointment made with neurology but has not been contacted by cardiology. Has not had episodes of syncope since 2018 but has had a total of 8 syncopal episodes in her lifetime. Syncope usually preceded by lightheadedness. Has experienced lightheadedness 2-3 months ago but no syncope occurred. Does not endorse any other associated symptoms.    Pt complains of right sided mass in the region of the latissimus. Says she felt a small lump that seems to have progressively grown over time. Associated with some discomfort but otherwise, does not endorse erythema, fever, chills, injury, or impaired function of RUE/back.  ROS Review of Systems  Constitutional: Negative for chills, fever and malaise/fatigue.  Eyes: Negative for blurred vision.  Respiratory: Negative for shortness of breath.   Cardiovascular: Negative for chest pain and palpitations.  Gastrointestinal: Negative for abdominal pain and nausea.  Genitourinary: Negative for dysuria and hematuria.  Musculoskeletal: Negative for joint pain and myalgias.  Skin: Negative  for rash.       Mass in the back  Neurological: Negative for tingling and headaches.  Psychiatric/Behavioral: Negative for depression. The patient is not nervous/anxious.     Objective:  BP 110/79 (BP Location: Left Arm, Patient Position: Sitting, Cuff Size: Normal)   Pulse 84   Temp 98.2 F (36.8 C) (Oral)   Ht 5\' 2"  (1.575 m)   Wt 151 lb (68.5 kg)   LMP 09/18/2018 (Approximate)   SpO2 96%   Breastfeeding? No   BMI 27.62 kg/m   BP/Weight 10/02/2018 08/02/2018 9/56/2130  Systolic BP 865 784 696  Diastolic BP 79 74 67  Wt. (Lbs) 151 149 150.2  BMI 27.62 27.25 27.47      Physical Exam  Constitutional: She is oriented to person, place, and time.  Well developed, well nourished, NAD, polite  HENT:  Head: Normocephalic and atraumatic.  Eyes: No scleral icterus.  Neck: Normal range of motion. Neck supple. No thyromegaly present.  Cardiovascular: Normal rate, regular rhythm and normal heart sounds.  Pulmonary/Chest: Effort normal and breath sounds normal.  Musculoskeletal: She exhibits no edema.  Full aROM of the RUE.  Neurological: She is alert and oriented to person, place, and time.  Skin: Skin is warm and dry. No rash noted. No erythema. No pallor.  Freely movable mass in the area of the right upper latissimus. No overlying erythema, induration, or lesion.  Psychiatric: She has a normal mood and affect. Her behavior is normal. Thought content normal.  Vitals reviewed.    Assessment & Plan:   1. Syncope and collapse - Awaiting evaluation by cardiology and neurology - Work up thus far negative, See HPI.  2. Soft tissue mass - Ambulatory  referral to Plastic Surgery - Likely a lipoma  3. Need for prophylactic vaccination and inoculation against influenza - Flu Vaccine QUAD 6+ mos PF IM (Fluarix Quad PF)     Follow-up: Return in about 4 weeks (around 10/30/2018) for Syncope and collapse.   Clent Demark PA

## 2018-10-02 NOTE — Patient Instructions (Signed)
Lipoma (Lipoma) Un lipoma es un tumor no canceroso (benigno) formado por clulas de grasa. Es un tipo muy frecuente de crecimiento en los tejidos blandos. Por lo general, los lipomas se encuentran debajo de la piel (subcutneos). Pueden aparecer en cualquier tejido del cuerpo que contenga grasa. Las reas en las que los lipomas aparecen con mayor frecuencia incluyen la espalda, los hombros, las nalgas y los muslos. Los lipomas crecen lentamente y, en general, son indoloros. La mayora de los lipomas no causan problemas y no requieren tratamiento. CAUSAS Se desconoce la causa de esta afeccin. FACTORES DE RIESGO Es ms probable que esta afeccin se manifieste en:  Personas de 40a 60aos.  Personas con antecedentes familiares de lipomas. SNTOMAS Por lo general, el lipoma aparece como un pequeo bulto redondo debajo de la piel. Puede ser suave o gomoso al tacto, y la firmeza puede variar. La mayora de los lipomas no son dolorosos. Sin embargo, el lipoma puede doler si se encuentra en un rea en la que hace presin sobre los nervios. DIAGNSTICO Por lo general, el lipoma puede diagnosticarse con un examen fsico. Tambin pueden hacerle estudios para confirmar el diagnstico y descartar otras enfermedades. Entre ellos:  Pruebas de diagnstico por imgenes, como resonancia magntica o tomografa computada.  Extraccin de una muestra de tejido para analizar con un microscopio (biopsia). TRATAMIENTO Los lipomas pequeos que no causan problemas no requieren tratamiento. Si el lipoma contina creciendo o causa problemas, la extirpacin suele ser la mejor opcin. Los lipomas tambin pueden extirparse para mejorar el aspecto. Por lo general, esto consiste en una ciruga en la que se extirpan las clulas de grasa y la cpsula que las rodea. Para este procedimiento, se utiliza con frecuencia un medicamento que adormece el rea (anestesia local). INSTRUCCIONES PARA EL CUIDADO EN EL HOGAR  Concurra a  todas las visitas de control como se lo haya indicado el mdico. Esto es importante. SOLICITE ATENCIN MDICA SI:  El lipoma se agranda o se endurece.  El lipoma comienza a causarle dolor, se enrojece o se hincha cada vez ms. Estos podran ser signos de infeccin o de una afeccin ms grave. Esta informacin no tiene como fin reemplazar el consejo del mdico. Asegrese de hacerle al mdico cualquier pregunta que tenga. Document Released: 08/31/2005 Document Revised: 04/07/2015 Document Reviewed: 11/17/2014 Elsevier Interactive Patient Education  2018 Elsevier Inc.   

## 2018-10-12 ENCOUNTER — Ambulatory Visit (INDEPENDENT_AMBULATORY_CARE_PROVIDER_SITE_OTHER): Payer: Self-pay | Admitting: Cardiovascular Disease

## 2018-10-12 ENCOUNTER — Encounter: Payer: Self-pay | Admitting: Cardiovascular Disease

## 2018-10-12 ENCOUNTER — Telehealth (HOSPITAL_COMMUNITY): Payer: Self-pay

## 2018-10-12 VITALS — BP 115/72 | HR 84 | Ht 62.0 in | Wt 151.4 lb

## 2018-10-12 DIAGNOSIS — I951 Orthostatic hypotension: Secondary | ICD-10-CM

## 2018-10-12 DIAGNOSIS — R000 Tachycardia, unspecified: Secondary | ICD-10-CM

## 2018-10-12 DIAGNOSIS — R55 Syncope and collapse: Secondary | ICD-10-CM

## 2018-10-12 DIAGNOSIS — R079 Chest pain, unspecified: Secondary | ICD-10-CM

## 2018-10-12 DIAGNOSIS — G90A Postural orthostatic tachycardia syndrome (POTS): Secondary | ICD-10-CM

## 2018-10-12 NOTE — Progress Notes (Signed)
Cardiology Office Note   Date:  10/17/2018   ID:  Reine, Bristow 1986-10-07, MRN [id number]  PCP:  Clent Demark, PA-C  Cardiologist:   Skeet Latch, MD   No chief complaint on file.    History of Present Illness: Cassandra Turner is a 32 y.o. female with pre-diabetes who is being seen today for the evaluation of recurrent syncope at the request of Clent Demark, PA-C.  Cassandra Turner saw Domenica Fail, PA-C, and reported episodes of pre-syncope.  She reports 8 episodes of syncope in her lifetime.   The episodes have been ongoing for over three years.  The most recent occurred two years ago when planting plants in her garden.  She stood up quickly and passed out.  There were no associated palpitations or shortness of breath.  She doesn't recall having any warning.  She continues to have episodes of orthostatic dizziness but no recent syncope.  Prior to that she had an episode of syncope that occurred after standing for a while at a concert.  She notes that she sometimes gets anxious around a lot of people.  She started feeling hot and cold and then passed out.  Another episode occurred when she was pregnant.  She is unable to exercise because she had one episode with exercise and is afraid to try again. She denies exertional chest pain or shortness of breath.  She also denies palpitations.  She rarely has non-exertional chest pain that is associated with stress.  She gets short of breath when it occurs.  These episodes always seem to occur when she is seated.  She denies LE edema othen than during pregnancy.  She denies orthopnea or PND.  Thyroid function, blood counts, CMP metanephrines and catecholamines have been negative.  EKG has been unremarkable.  Past Medical History:  Diagnosis Date  . Gestational diabetes 2006  . POTS (postural orthostatic tachycardia syndrome) 10/17/2018  . Recurrent syncope 10/17/2018    Past Surgical History:  Procedure  Laterality Date  . NO PAST SURGERIES       No current outpatient medications on file.   No current facility-administered medications for this visit.     Allergies:   Patient has no known allergies.    Social History:  The patient  reports that she has never smoked. She has never used smokeless tobacco. She reports that she does not drink alcohol or use drugs.   Family History:  The patient's family history includes Anxiety disorder in her father; Asthma in her son; Cancer in her mother; Diabetes in her brother and sister; Heart disease in her father; High Cholesterol in her brother and sister; Hypertension in her sister.    ROS:  Please see the history of present illness.   Otherwise, review of systems are positive for none.   All other systems are reviewed and negative.    PHYSICAL EXAM: VS:  BP 115/72 (BP Location: Right Arm)   Pulse 84   Ht 5\' 2"  (1.575 m)   Wt 151 lb 6.4 oz (68.7 kg)   LMP 09/18/2018 (Approximate)   BMI 27.69 kg/m  , BMI Body mass index is 27.69 kg/m. GENERAL:  Well appearing HEENT:  Pupils equal round and reactive, fundi not visualized, oral mucosa unremarkable NECK:  No jugular venous distention, waveform within normal limits, carotid upstroke brisk and symmetric, no bruits LUNGS:  Clear to auscultation bilaterally HEART:  RRR.  PMI not displaced or sustained,S1 and S2 within normal limits, no S3,  no S4, no clicks, no rubs, no murmurs ABD:  Flat, positive bowel sounds normal in frequency in pitch, no bruits, no rebound, no guarding, no midline pulsatile mass, no hepatomegaly, no splenomegaly EXT:  2 plus pulses throughout, no edema, no cyanosis no clubbing SKIN:  No rashes no nodules NEURO:  Cranial nerves II through XII grossly intact, motor grossly intact throughout PSYCH:  Cognitively intact, oriented to person place and time   EKG:  EKG is not ordered today.   Recent Labs: 06/19/2018: ALT 8; BUN 9; Creatinine, Ser 0.70; Hemoglobin 12.8;  Platelets 366; Potassium 4.0; Sodium 137; TSH 1.880    Lipid Panel No results found for: CHOL, TRIG, HDL, CHOLHDL, VLDL, LDLCALC, LDLDIRECT    Wt Readings from Last 3 Encounters:  10/12/18 151 lb 6.4 oz (68.7 kg)  10/02/18 151 lb (68.5 kg)  08/02/18 149 lb (67.6 kg)      ASSESSMENT AND PLAN:  # Recurrent syncope: No recent episodes though she does continue to have orthostatic lightheadedness.  Orthostatics were negative when checked with her PCP in August.  Today her heart rate increased from 76 lying to 90 standing and 97 after standing 3 minutes, consistent with mild POTS.  I suspect she has a combination of POTS and orthostatic hypotension.  She will start with working on increasing salt and fluid intake.  I also recommended that she wear compression stockings.  She had episodes of syncope with exertion that are more concerning and preventing her from exercising.  We will get an ETT and an echo.  EKG and labs have been unremarkable.     Current medicines are reviewed at length with the patient today.  The patient does not have concerns regarding medicines.  The following changes have been made:  no change  Labs/ tests ordered today include:   Orders Placed This Encounter  Procedures  . Exercise Tolerance Test  . ECHOCARDIOGRAM COMPLETE     Disposition:   FU with Crystale Giannattasio C. Oval Linsey, MD, [facility] in 6 weeks.    Signed, Trellis Guirguis C. Oval Linsey, MD, [facility]  10/17/2018 8:09 PM    [place]

## 2018-10-12 NOTE — Telephone Encounter (Signed)
Encounter complete. 

## 2018-10-12 NOTE — Patient Instructions (Addendum)
Es importante que tome mas sal en su dieta. Continua a beber muchas liquidos.  Es mejor si puede beber mas de 3L cada dia. Puede comprar medias de comprension.   Medication Instructions:  NO CHANGES  Lab work: NONE   Testing/Procedures: Your physician has requested that you have an exercise tolerance test. For further information please visit [email or web address]. Please also follow instruction sheet, as given.  Your physician has requested that you have an echocardiogram. Echocardiography is a painless test that uses sound waves to create images of your heart. It provides your doctor with information about the size and shape of your heart and how well your heart's chambers and valves are working. This procedure takes approximately one hour. There are no restrictions for this procedure. [address]  Follow-Up: At [facility], you and your health needs are our priority.  As part of our continuing mission to provide you with exceptional heart care, we have created designated Provider Care Teams.  These Care Teams include your primary Cardiologist (physician) and Advanced Practice Providers (APPs -  Physician Assistants and Nurse Practitioners) who all work together to provide you with the care you need, when you need it. You will need a follow up appointment in 6-8 WEEKS  Please call our office 2 months in advance to schedule this appointment.  You may see [facility]  or one of the following Advanced Practice Providers on your designated Care Team:   Kerin Ransom, PA-C Roby Lofts, [place] . Sande Rives, PA-C  Any Other Special Instructions Will Be Listed Below (If Applicable).  Ecocardiograma (Echocardiogram) El ecocardiograma utiliza ondas sonoras (ultrasonido) para obtener una imagen del corazn. El ecocardiograma es simple e indoloro, se obtiene en un perodo de tiempo breve y proporciona informacin valiosa a su mdico. Las imgenes de un  ecocardiograma pueden proporcionar el siguiente tipo de informacin:  Evidencia de enfermedad arterial coronaria (EAC).  Tamao del corazn.  Funcin del msculo cardaco.  Funcin de la vlvula cardaca.  Deteccin de aneurisma.  Evidencia de un infarto de miocardio pasado.  Acumulacin de lquido alrededor del corazn.  Engrosamiento del msculo cardaco.  Evaluacin de la funcin de la vlvula cardaca. INFORME A SU MDICO:  Cualquier alergia que tenga.  Todos los [employer], incluidos vitaminas, hierbas, gotas oftlmicas, cremas y medicamentos de venta libre.  Problemas previos que usted o los [employer] de su familia hayan tenido con el uso de anestsicos.  Enfermedades de la sangre que tenga.  Cirugas previas.  Enfermedades que tenga.  Posibilidad de embarazo, si corresponde.  ANTES DEL PROCEDIMIENTO No se requiere una preparacin especial. Coma y beba con normalidad. PROCEDIMIENTO  Para lograr una imagen del corazn, se aplicar un gel en el pecho y luego se pasar un instrumento similar a una vara (transductor) sobre este. El gel ayudar a transmitir las [employer] del transductor. Las ondas sonoras rebotarn inofensivamente en el corazn para permitir capturar las imgenes del corazn en movimiento, en tiempo real. Luego, las imgenes se Clinical cytogeneticist.  Quiz necesite una va IV para recibir un medicamento que mejore la calidad de las imgenes.  DESPUS DEL PROCEDIMIENTO Puede retomar su rutina normal, incluidos la dieta, las actividades y [employer], a menos que su mdico le indique lo contrario. Esta informacin no tiene Marine scientist el consejo del mdico. Asegrese de hacerle al mdico cualquier pregunta que tenga. Document Released: 11/21/2005 Document Revised: 12/12/2014 Document Reviewed: 07/29/2013 Elsevier Interactive Patient Education  2017  [place] de esfuerzo (Exercise Stress  Electrocardiogram) Un electrocardiograma de esfuerzo es un estudio para controlar cmo circula la sangre hacia el corazn. El objetivo es buscar zonas con un flujo sanguneo deficiente. En [employer], deber caminar sobre una cinta [place]. El Psychologist, occupational los latidos cardacos cuando est en reposo y cuando haga ejercicio. ANTES DEL PROCEDIMIENTO  No consuma bebidas ni alimentos con cafena durante las 24horas previas al estudio, o como se lo haya indicado el mdico. Esto incluye caf, t (incluso t descafeinado), gaseosas, chocolate y cacao.  Siga las instrucciones del mdico respecto de lo que debe comer y beber antes del [place].  Consulte a su mdico qu medicamentos debe o no debe tomar antes del estudio. Tome los medicamentos con agua, a menos que el mdico le indique lo contrario.  Si [place] un inhalador, Nurse, mental health momento del [place].  Traiga un bocadillo para comer despus de la prueba.  No fume durante las 4horas previas al estudio.  No se aplique lociones, polvos, cremas ni aceites en el pecho antes del estudio.  Use ropa y calzado cmodos. PROCEDIMIENTO  Se le colocarn parches sobre el pecho. Es posible que se deban afeitar pequeas zonas del pecho. Se conectarn cables a los parches.  Se controlar su frecuencia cardaca mientras est en reposo y mientras haga ejercicio.  Caminar sobre una [place]. La cinta caminadora ir aumentando la velocidad lentamente para elevar su frecuencia cardaca.  El estudio durar aproximadamente entre 1 y Child psychotherapist. DESPUS DEL PROCEDIMIENTO  Despus del estudio, se le controlar la frecuencia cardaca y la presin arterial.  Puede retomar una dieta normal, las actividades y los medicamentos, segn las indicaciones del mdico. Esta informacin no tiene Marine scientist el consejo del mdico. Asegrese de hacerle al mdico cualquier pregunta que tenga. Document Released: 11/03/2008 Document Revised:  12/12/2014 Document Reviewed: 07/29/2013 Elsevier Interactive Patient Education  [employer].

## 2018-10-16 ENCOUNTER — Ambulatory Visit (HOSPITAL_COMMUNITY)
Admission: RE | Admit: 2018-10-16 | Discharge: 2018-10-16 | Disposition: A | Discharge status: Home / Self Care | Payer: No Typology Code available for payment source | Source: Ambulatory Visit | Attending: Internal Medicine | Admitting: Internal Medicine

## 2018-10-16 DIAGNOSIS — R079 Chest pain, unspecified: Secondary | ICD-10-CM | POA: Insufficient documentation

## 2018-10-16 DIAGNOSIS — R55 Syncope and collapse: Secondary | ICD-10-CM

## 2018-10-16 LAB — EXERCISE TOLERANCE TEST
CHL CUP MPHR: 186 {beats}/min
CSEPEDS: 0 s
CSEPHR: 102 %
Estimated workload: 10.1 METS
Exercise duration (min): 9 min
Peak HR: 190 {beats}/min
RPE: 19
Rest HR: 78 {beats}/min

## 2018-10-17 ENCOUNTER — Encounter: Payer: Self-pay | Admitting: Cardiovascular Disease

## 2018-10-17 ENCOUNTER — Telehealth (INDEPENDENT_AMBULATORY_CARE_PROVIDER_SITE_OTHER): Payer: Self-pay

## 2018-10-17 DIAGNOSIS — G90A Postural orthostatic tachycardia syndrome (POTS): Secondary | ICD-10-CM

## 2018-10-17 DIAGNOSIS — I951 Orthostatic hypotension: Principal | ICD-10-CM

## 2018-10-17 DIAGNOSIS — I498 Other specified cardiac arrhythmias: Secondary | ICD-10-CM

## 2018-10-17 DIAGNOSIS — R000 Tachycardia, unspecified: Principal | ICD-10-CM

## 2018-10-17 DIAGNOSIS — R55 Syncope and collapse: Secondary | ICD-10-CM | POA: Insufficient documentation

## 2018-10-17 HISTORY — DX: Syncope and collapse: R55

## 2018-10-17 HISTORY — DX: Postural orthostatic tachycardia syndrome (POTS): G90.A

## 2018-10-17 HISTORY — DX: Other specified cardiac arrhythmias: I49.8

## 2018-10-17 NOTE — Telephone Encounter (Signed)
-----   Message from Clent Demark, PA-C sent at 10/17/2018 12:27 PM EST ----- Normal results with cardiology.

## 2018-10-17 NOTE — Telephone Encounter (Signed)
Call placed using pacific interpreter [phone]) patient is aware that stress test with cardiology was normal. Nat Christen, [place]

## 2018-10-19 ENCOUNTER — Ambulatory Visit (INDEPENDENT_AMBULATORY_CARE_PROVIDER_SITE_OTHER): Payer: No Typology Code available for payment source | Admitting: Diagnostic Neuroimaging

## 2018-10-19 ENCOUNTER — Encounter: Payer: Self-pay | Admitting: Diagnostic Neuroimaging

## 2018-10-19 VITALS — BP 109/70 | HR 86 | Ht 63.0 in | Wt 152.4 lb

## 2018-10-19 DIAGNOSIS — R55 Syncope and collapse: Secondary | ICD-10-CM

## 2018-10-19 NOTE — Progress Notes (Signed)
[facility]  PATIENT: Cassandra Turner DOB: 1986/01/06  REFERRING CLINICIAN: Link Snuffer HISTORY FROM: patient (via interpreter) REASON FOR VISIT: new consult    HISTORICAL  CHIEF COMPLAINT:  Chief Complaint  Patient presents with  . Syncope and collapse    rm 6, New PT, interpreter- Ana, "passed out, several times, has been happening many years but not as frequently as it used to"    HISTORY OF PRESENT ILLNESS:   32 year old female here for evaluation of recurrent syncope and presyncope.  Since age 54 years old patient has had at least 4 events of loss of consciousness.  These were unprovoked events.  Patient would feel lightheaded or dizzy and then passed out.  Sometimes she will wake up with vomiting.  No convulsions, tongue biting, incontinence or postictal confusion.  Patient also has had intermittent episodes of feeling lightheaded and dizzy, and sitting or standing position.  Some of these events have occurred in the setting of physical exertion and exercise.  Sometimes symptoms can last up to a day at a time.  Patient has been to cardiology and diagnosed with possible pots and orthostatic hypotension.  She was advised to use compression stockings, increase salt and fluid intake.  Symptoms have been slightly better in the last couple of weeks.  No slurred speech, numbness, vision loss or gait difficulty.   REVIEW OF SYSTEMS: Full 14 system review of systems performed and negative with exception of: Dizziness.  ALLERGIES: No Known Allergies  HOME MEDICATIONS: No outpatient medications prior to visit.   No facility-administered medications prior to visit.     PAST MEDICAL HISTORY: Past Medical History:  Diagnosis Date  . Gestational diabetes 2006  . POTS (postural orthostatic tachycardia syndrome) 10/17/2018  . Recurrent syncope 10/17/2018    PAST SURGICAL HISTORY: Past Surgical History:  Procedure Laterality Date  . NO PAST  SURGERIES      FAMILY HISTORY: Family History  Problem Relation Age of Onset  . Cancer Mother        Thyroid Cancer  . Diabetes Sister   . Hypertension Sister   . High Cholesterol Sister   . Asthma Son   . Heart disease Father   . Anxiety disorder Father   . High Cholesterol Brother   . Diabetes Brother     SOCIAL HISTORY: Social History   Socioeconomic History  . Marital status: Married    Spouse name: Cory Roughen  . Number of children: 3  . Years of education: 8  . Highest education level: Not on file  Occupational History    Comment: NA  Social Needs  . Financial resource strain: Not on file  . Food insecurity:    Worry: Not on file    Inability: Not on file  . Transportation needs:    Medical: Not on file    Non-medical: Not on file  Tobacco Use  . Smoking status: Never Smoker  . Smokeless tobacco: Never Used  Substance and Sexual Activity  . Alcohol use: No  . Drug use: No  . Sexual activity: Yes  Lifestyle  . Physical activity:    Days per week: Not on file    Minutes per session: Not on file  . Stress: Not on file  Relationships  . Social connections:    Talks on phone: Not on file    Gets together: Not on file    Attends religious service: Not on file    Active member of club or organization: Not on file  Attends meetings of clubs or organizations: Not on file    Relationship status: Not on file  . Intimate partner violence:    Fear of current or ex partner: Not on file    Emotionally abused: Not on file    Physically abused: Not on file    Forced sexual activity: Not on file  Other Topics Concern  . Not on file  Social History Narrative   Lives with husband, 3 children   Caffeine- coffee 1 cup     PHYSICAL EXAM  GENERAL EXAM/CONSTITUTIONAL: Vitals:  Vitals:   10/19/18 1021  BP: 109/70  Pulse: 86  Weight: 152 lb 6.4 oz (69.1 kg)  Height: 5\' 3"  (1.6 m)  No data found.    Body mass index is 27 kg/m. Wt Readings from Last 3  Encounters:  10/19/18 152 lb 6.4 oz (69.1 kg)  10/12/18 151 lb 6.4 oz (68.7 kg)  10/02/18 151 lb (68.5 kg)     Patient is in no distress; well developed, nourished and groomed; neck is supple  CARDIOVASCULAR:  Examination of carotid arteries is normal; no carotid bruits  Regular rate and rhythm, no murmurs  Examination of peripheral vascular system by observation and palpation is normal  EYES:  Ophthalmoscopic exam of optic discs and posterior segments is normal; no papilledema or hemorrhages  No exam data present  MUSCULOSKELETAL:  Gait, strength, tone, movements noted in Neurologic exam below  NEUROLOGIC: MENTAL STATUS:  No flowsheet data found.  awake, alert, oriented to person, place and time  recent and remote memory intact  normal attention and concentration  language fluent, comprehension intact, naming intact  fund of knowledge appropriate  CRANIAL NERVE:   2nd - no papilledema on fundoscopic exam  2nd, 3rd, 4th, 6th - pupils equal and reactive to light, visual fields full to confrontation, extraocular muscles intact, no nystagmus  5th - facial sensation symmetric  7th - facial strength symmetric  8th - hearing intact  9th - palate elevates symmetrically, uvula midline  11th - shoulder shrug symmetric  12th - tongue protrusion midline  MOTOR:   normal bulk and tone, full strength in the BUE, BLE  SENSORY:   normal and symmetric to light touch, temperature, vibration  COORDINATION:   finger-nose-finger, fine finger movements normal  REFLEXES:   deep tendon reflexes present and symmetric  GAIT/STATION:   narrow based gait; able to tandem     DIAGNOSTIC DATA (LABS, IMAGING, TESTING) - I reviewed patient records, labs, notes, testing and imaging myself where available.  Lab Results  Component Value Date   WBC 7.9 06/19/2018   HGB 12.8 06/19/2018   HCT 38.5 06/19/2018   MCV 92 06/19/2018   PLT 366 06/19/2018        Component Value Date/Time   NA 137 06/19/2018 1624   K 4.0 06/19/2018 1624   CL 102 06/19/2018 1624   CO2 20 06/19/2018 1624   GLUCOSE 84 06/19/2018 1624   BUN 9 06/19/2018 1624   CREATININE 0.70 06/19/2018 1624   CALCIUM 9.2 06/19/2018 1624   PROT 7.2 06/19/2018 1624   ALBUMIN 4.2 06/19/2018 1624   AST 9 06/19/2018 1624   ALT 8 06/19/2018 1624   ALKPHOS 77 06/19/2018 1624   BILITOT 0.3 06/19/2018 1624   GFRNONAA 113 06/19/2018 1624   GFRAA 131 06/19/2018 1624   No results found for: CHOL, HDL, LDLCALC, LDLDIRECT, TRIG, CHOLHDL Lab Results  Component Value Date   HGBA1C 5.8 (A) 06/19/2018   No results  found for: VITAMINB12 Lab Results  Component Value Date   TSH 1.880 06/19/2018    08/17/18 CT head [I reviewed images myself and agree with interpretation. -VRP]  - Probable cerumen in the right external auditory canal. Study otherwise unremarkable.   ASSESSMENT AND PLAN  32 y.o. year old female here with recurrent syncope and presyncope.   Dx:  1. Syncope, unspecified syncope type     PLAN:  RECURRENT SYNCOPE (last event 2017) + presyncope (? POTS and orthostatic hypotension; no evidence of seizure or other primary neurologic etiologies) - monitor symptoms; follow up with PCP and cardiology  Return if symptoms worsen or fail to improve, for return to PCP.    Penni Bombard, MD 32/95/1884, 16:60 AM Certified in Neurology, Neurophysiology and Neuroimaging  [facility] [address] [place], [place] [address] [phone]

## 2018-10-22 ENCOUNTER — Ambulatory Visit (INDEPENDENT_AMBULATORY_CARE_PROVIDER_SITE_OTHER): Payer: No Typology Code available for payment source | Admitting: Surgery

## 2018-10-22 ENCOUNTER — Other Ambulatory Visit: Payer: Self-pay | Admitting: Surgery

## 2018-10-22 ENCOUNTER — Encounter: Payer: Self-pay | Admitting: Surgery

## 2018-10-22 ENCOUNTER — Other Ambulatory Visit: Payer: Self-pay

## 2018-10-22 ENCOUNTER — Encounter: Payer: Self-pay | Admitting: *Deleted

## 2018-10-22 VITALS — BP 123/85 | HR 77 | Temp 97.7°F | Resp 18 | Ht 63.0 in | Wt 151.2 lb

## 2018-10-22 DIAGNOSIS — D171 Benign lipomatous neoplasm of skin and subcutaneous tissue of trunk: Secondary | ICD-10-CM

## 2018-10-22 NOTE — Progress Notes (Signed)
Patient was seen today in the office with Dr. Dahlia Byes.   Dr. Dahlia Byes wants patient to have a bilateral breast ultrasound and right axillary ultrasound done and have her return in 2 weeks to discuss results.   Patient requested to have this done in [place].   I called [facility] today to schedule and they said they do not take [employer]  but have provided me with contact information for Wynelle Beckmann at [phone]. They said Altha Harm could get patient enrolled in a program that will cover the ultrasounds for free. I called number above but had to leave a message.   Patient was also provided with the contact information for [facility] [facility] today.   I did go ahead and schedule patient's follow up for 2 weeks out with Dr. Dahlia Byes for 11-05-18 at 10:30 am. Spanish interpreter has been requested.   The patient is aware to be expecting a call from Altha Harm to get enrolled in the program and she will be able to schedule the ultrasounds for the patient once enrolled in the program.   Patient is aware that if ultrasounds have not been done prior to follow up appointment with Dr. Dahlia Byes that she will need to reschedule until complete.

## 2018-10-22 NOTE — Progress Notes (Signed)
Patient ID: Cassandra Turner, female   DOB: 1986/09/09, 32 y.o.   MRN: [id number]  HPI Cassandra Turner is a 32 y.o. female seen for a right axillary soft tissue mass.  She reports that it has been present for the last 3 years.  He had slowly growing in size.  No fevers no chills no B type symptoms.  No history of breast cancer no history of breast issues.  She denies any nipple discharge.  No pain.  No images studies are available.  She is able to perform more than 4 METS of activity.  She does have a history of a near syncopal episode and has been work-up.  So far all the studies have been negative. No Family history of breast cancer  HPI  Past Medical History:  Diagnosis Date  . Gestational diabetes 2006  . POTS (postural orthostatic tachycardia syndrome) 10/17/2018  . Recurrent syncope 10/17/2018    Past Surgical History:  Procedure Laterality Date  . NO PAST SURGERIES      Family History  Problem Relation Age of Onset  . Cancer Mother        Thyroid Cancer  . Diabetes Sister   . Hypertension Sister   . High Cholesterol Sister   . Asthma Son   . Heart disease Father   . Anxiety disorder Father   . High Cholesterol Brother   . Diabetes Brother     Social History Social History   Tobacco Use  . Smoking status: Never Smoker  . Smokeless tobacco: Never Used  Substance Use Topics  . Alcohol use: No  . Drug use: No    No Known Allergies  No current outpatient medications on file.   No current facility-administered medications for this visit.      Review of Systems Full ROS  was asked and was negative except for the information on the HPI  Physical Exam Blood pressure 123/85, pulse 77, temperature 97.7 F (36.5 C), temperature source Temporal, resp. rate 18, height 5\' 3"  (1.6 m), weight 151 lb 3.2 oz (68.6 kg), SpO2 98 %, not currently breastfeeding. CONSTITUTIONAL: NAD EYES: Pupils are equal, round, and reactive to light, Sclera are  non-icteric. EARS, NOSE, MOUTH AND THROAT: The oropharynx is clear. The oral mucosa is pink and moist. Hearing is intact to voice. LYMPH NODES:  Lymph nodes in the neck are normal. RESPIRATORY:  Lungs are clear. There is normal respiratory effort, with equal breath sounds bilaterally, and without pathologic use of accessory muscles. CARDIOVASCULAR: Heart is regular without murmurs, gallops, or rubs. BREAST: no masses, nml nipples. No skin changes Axilla: there is a Right axillary soft tissue mass 7-8cm in size c/w lipoma, mobile and soft. GI: The abdomen is soft, nontender, and nondistended. There are no palpable masses. There is no hepatosplenomegaly. There are normal bowel sounds in all quadrants. GU: Rectal deferred.   MUSCULOSKELETAL: Normal muscle strength and tone. No cyanosis or edema.   SKIN: Turgor is good and there are no pathologic skin lesions or ulcers. NEUROLOGIC: Motor and sensation is grossly normal. Cranial nerves are grossly intact. PSYCH:  Oriented to person, place and time. Affect is normal.  Data Reviewed  I have personally reviewed the patient's imaging, laboratory findings and medical records.    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  Given the size of the lipoma being about 7 cm I would like to obtain some imaging studies of the axilla and specifically of the breast.  I do have low pretest  probably for malignancy but nevertheless this is the appropriate thing to do.  Discussed with the patient detail about my thought process.  I will see her back in a couple weeks once we have imaging studies.  After we confirm that this is a lipoma we will schedule excision either in the office or in the operating room depending on her preference.  Intensive counseling provided to the patient and her husband  Cassandra Hamman, MD [facility] General Surgeon 10/22/2018, 10:12 AM

## 2018-10-22 NOTE — Patient Instructions (Signed)
Patient will need a Bilateral Breast and Right Axillary Breast US. She is to return to the office in 2 weeks to see Dr.Pabon.

## 2018-10-25 ENCOUNTER — Encounter: Payer: Self-pay | Admitting: *Deleted

## 2018-10-25 ENCOUNTER — Other Ambulatory Visit (HOSPITAL_COMMUNITY): Payer: Self-pay | Admitting: *Deleted

## 2018-10-25 DIAGNOSIS — R2231 Localized swelling, mass and lump, right upper limb: Secondary | ICD-10-CM

## 2018-10-25 NOTE — Progress Notes (Signed)
Christine with the [employer] program through [place] (phone: [phone]) called our office back in regards to this patient.   She will contact the patient about getting her enrolled in the program.   Patient is scheduled to be seen on 11-22-18 to be enrolled.   Altha Harm will arrange for patient to [facility]   Patient will be contacted early next week to reschedule office visit with Dr. Dahlia Byes until after imaging has been completed.

## 2018-10-29 ENCOUNTER — Ambulatory Visit (HOSPITAL_COMMUNITY): Payer: No Typology Code available for payment source | Attending: Internal Medicine

## 2018-10-29 ENCOUNTER — Other Ambulatory Visit: Payer: Self-pay

## 2018-10-29 DIAGNOSIS — R079 Chest pain, unspecified: Secondary | ICD-10-CM | POA: Insufficient documentation

## 2018-10-29 DIAGNOSIS — R55 Syncope and collapse: Secondary | ICD-10-CM | POA: Insufficient documentation

## 2018-10-29 MED ORDER — PERFLUTREN LIPID MICROSPHERE
1.0000 mL | INTRAVENOUS | Status: AC | PRN
  Administered 2018-10-29: 2 mL via INTRAVENOUS

## 2018-10-30 ENCOUNTER — Ambulatory Visit (INDEPENDENT_AMBULATORY_CARE_PROVIDER_SITE_OTHER): Payer: Self-pay | Admitting: Physician Assistant

## 2018-11-05 ENCOUNTER — Ambulatory Visit: Payer: No Typology Code available for payment source | Admitting: Surgery

## 2018-11-09 ENCOUNTER — Telehealth (INDEPENDENT_AMBULATORY_CARE_PROVIDER_SITE_OTHER): Payer: Self-pay

## 2018-11-09 NOTE — Telephone Encounter (Signed)
-----   Message from Clent Demark, PA-C sent at 11/05/2018  5:29 PM EST ----- Normal echocardiogram.

## 2018-11-09 NOTE — Telephone Encounter (Signed)
Call placed using pacific interpreter [phone]) left voicemail notifying patient that echocardiogram was normal. Nat Christen, CMA

## 2018-11-15 ENCOUNTER — Ambulatory Visit (INDEPENDENT_AMBULATORY_CARE_PROVIDER_SITE_OTHER): Payer: Self-pay | Admitting: Physician Assistant

## 2018-11-15 ENCOUNTER — Encounter (INDEPENDENT_AMBULATORY_CARE_PROVIDER_SITE_OTHER): Payer: Self-pay | Admitting: Physician Assistant

## 2018-11-15 ENCOUNTER — Other Ambulatory Visit: Payer: Self-pay

## 2018-11-15 VITALS — BP 119/78 | HR 86 | Temp 98.1°F | Ht 63.0 in | Wt 150.2 lb

## 2018-11-15 DIAGNOSIS — G90A Postural orthostatic tachycardia syndrome (POTS): Secondary | ICD-10-CM

## 2018-11-15 DIAGNOSIS — R000 Tachycardia, unspecified: Secondary | ICD-10-CM

## 2018-11-15 DIAGNOSIS — I951 Orthostatic hypotension: Secondary | ICD-10-CM

## 2018-11-15 NOTE — Patient Instructions (Signed)
Sndrome de taquicardia ortosttica postural (Postural Orthostatic Tachycardia Syndrome) El sndrome de taquicardia ortosttica postural (STOP) es un conjunto de sntomas que pueden producirse cuando una persona se pone de pie despus de haber estado [place]. Este sndrome ocurre cuando fluye menos sangre de lo normal al corazn despus de ponerse de pie. La reduccin del flujo de sangre al corazn [employer] latidos cardacos. Se puede asociar al sndrome de taquicardia ortosttica postural con otra afeccin, o bien puede ser espontneo. CAUSAS No se conoce la causa de esta afeccin; sin embargo, se la ha vinculado con muchas afecciones y [place]. FACTORES DE RIESGO Es ms probable que esta afeccin se manifieste en:  Las mujeres de [place] 30 y 69aos.  Las mujeres que estn embarazadas.  Las mujeres que estn [place].  Las personas que tienen determinadas enfermedades, por ejemplo: ? Infecciones virales. ? Una enfermedad autoinmunitaria. ? Anemia. ? Deshidratacin. ? Hipertiroidismo.  Las personas que toman determinados medicamentos.  Las personas que sufrieron una lesin importante.  Las [employer] se sometieron a Marine scientist. SNTOMAS El sntoma ms frecuente de esta afeccin es la sensacin de desvanecimiento despus de ponerse de pie tras haber estado acostado. Otros sntomas pueden ser los siguientes:  Un aumento rpido en la velocidad de los latidos cardacos (taquicardia) en el trmino de 51minutos despus de ponerse de pie.  Desmayos.  Debilidad.  Confusin.  Temblores.  Falta de aire.  Sudoracin o sofoco.  Dolor de [place].  Dolor en el pecho.  Respiracin ms profunda y ms rpida que lo normal (hiperventilacin).  Nuseas.  Ansiedad. Los sntomas pueden ser ms intensos a la maana y [place] cuando la persona se Programmer, systems. DIAGNSTICO Esta afeccin se diagnostica en funcin de lo siguiente:  Sus sntomas.  Sus antecedentes  mdicos.  Un examen fsico.  Mediciones de la frecuencia cardaca cuando est acostado y despus de que se pone de pie.  Medicin de la presin arterial. La medicin se realizar cuando pasa de estar acostado a ponerse de pie.  Anlisis de sangre para medir las hormonas que [place] con la presin arterial. Los anlisis de sangre se realizarn cuando est acostado y de pie. Pueden hacerle otros anlisis para determinar si tiene otra afeccin o enfermedad vinculada con el sndrome de taquicardia ortosttica postural. TRATAMIENTO El tratamiento de esta afeccin depende de la intensidad de los sntomas y de si tiene otras afecciones o enfermedades vinculadas al sndrome de taquicardia ortosttica postural. El tratamiento puede incluir lo siguiente:  Risk manager las afecciones o enfermedades vinculadas al sndrome de taquicardia ortosttica postural.  [employer] vasos de agua antes de levantarse despus de haber [place].  Aumentar la ingesta de sal (sodio).  Tomar medicamentos para controlar la presin arterial y la frecuencia cardaca (betabloqueantes).  Tomar medicamentos para controlar el flujo de sangre, la presin arterial o la frecuencia cardaca.  Evitar determinados medicamentos.  Comenzar un programa de ejercicios bajo la supervisin del mdico. INSTRUCCIONES PARA EL CUIDADO EN EL HOGAR Comida y bebida  Beba suficiente lquido para mantener la orina clara o de color amarillo plido.  [employer] vasos de agua antes de levantarse despus de haber estado acostado, si se lo indic el mdico.  Siga las indicaciones del mdico respecto de la cantidad de sodio que debe consumir.  Haga varias comidas pequeas durante el da en vez de unas pocas comidas abundantes.  Evite las comidas pesadas. Medicamentos  [employer] de venta libre y los recetados solamente como se lo haya indicado el mdico.  Hable con el mdico antes de empezar a tomar cualquier medicamento  nuevo. Actividad  Haga ejercicios aerbicos durante 54minutos diarios, por lo menos 3veces por semana.  Consulte al mdico qu tipos de ejercicios son seguros para usted. SOLICITE ATENCIN MDICA SI:  Los sntomas no mejoran despus del tratamiento.  Los sntomas empeoran.  Presenta nuevos sntomas. SOLICITE ATENCIN MDICA DE INMEDIATO SI:  Siente dolor en el pecho.  Tiene dificultad para respirar.  Sufre un [employer]. Esta informacin no tiene Marine scientist el consejo del mdico. Asegrese de hacerle al mdico cualquier pregunta que tenga. Document Released: 02/17/2009 Document Revised: 02/13/2012 Document Reviewed: 06/05/2015 Elsevier Interactive Patient Education  [employer].

## 2018-11-15 NOTE — Progress Notes (Signed)
   Subjective:  Patient ID: Cassandra Turner, female    DOB: 11-15-1986  Age: 32 y.o. MRN: [id number]  CC: f/u syncope and collapse  HPI Robyne Valdovinos-Valleis a 32 y.o.femalewith a medical history ofprediabetespresents to f/u on syncope and collapse. Work up thus far has been negative/normal for TSH, CBC, CMP, A1c, Metanephrines, and catecholamines. ANA was positive but reflex was negative or autoimmune disease. Orthostatics negative. EKG normal three months ago. Went to cardiology last month and was suspected of having mild POTS and orthostatic hypotension. Had normal echocardiogram. She was advised to use compression stockings, increase salt and fluid intake which she follows for the most part. Has not had any episodes of syncope since 2018. Neurology also assessed patient and found no evidence of seizure or other primary neurological etiologies. She is feeling well and will be seeing her cardiologist in approximately five weeks. Pt is grateful for the work up and referrals. Does not have any other symptoms or complaints.     ROS Review of Systems  Constitutional: Negative for chills, fever and malaise/fatigue.  Eyes: Negative for blurred vision.  Respiratory: Negative for shortness of breath.   Cardiovascular: Negative for chest pain and palpitations.  Gastrointestinal: Negative for abdominal pain and nausea.  Genitourinary: Negative for dysuria and hematuria.  Musculoskeletal: Negative for joint pain and myalgias.  Skin: Negative for rash.  Neurological: Negative for tingling and headaches.  Psychiatric/Behavioral: Negative for depression. The patient is not nervous/anxious.     Objective:  BP 119/78 (BP Location: Left Arm, Patient Position: Sitting, Cuff Size: Normal)   Pulse 86   Temp 98.1 F (36.7 C) (Oral)   Ht 5\' 3"  (1.6 m)   Wt 150 lb 3.2 oz (68.1 kg)   LMP 10/16/2018 (Approximate)   SpO2 98%   Breastfeeding No   BMI 26.61 kg/m   BP/Weight 11/15/2018  10/22/2018 72/53/6644  Systolic BP 034 742 595  Diastolic BP 78 85 70  Wt. (Lbs) 150.2 151.2 152.4  BMI 26.61 26.78 27      Physical Exam Vitals signs reviewed.  Constitutional:      Comments: Well developed, well nourished, NAD, polite  HENT:     Head: Normocephalic and atraumatic.  Eyes:     General: No scleral icterus. Neck:     Musculoskeletal: Normal range of motion and neck supple.     Thyroid: No thyromegaly.  Pulmonary:     Effort: Pulmonary effort is normal.  Skin:    General: Skin is warm and dry.     Coloration: Skin is not pale.     Findings: No erythema or rash.  Neurological:     Mental Status: She is alert and oriented to person, place, and time.  Psychiatric:        Behavior: Behavior normal.        Thought Content: Thought content normal.      Assessment & Plan:    1. POTS (postural orthostatic tachycardia syndrome) - Currently asymptomatic. Follow salt intake and compression stocking recommendations from cardiology. Keep upcoming appointment with cardiology.  2. Orthostatic hypotension - Currently asymptomatic. Follow salt intake and compression stocking recommendations from cardiology. Keep upcoming appointment with cardiology.    Follow-up: Return in about 3 months (around 02/14/2019) for PAP.   Clent Demark PA

## 2018-11-22 ENCOUNTER — Ambulatory Visit
Admission: RE | Admit: 2018-11-22 | Discharge: 2018-11-22 | Disposition: A | Discharge status: Home / Self Care | Payer: No Typology Code available for payment source | Source: Ambulatory Visit | Attending: Obstetrics and Gynecology | Admitting: Obstetrics and Gynecology

## 2018-11-22 ENCOUNTER — Ambulatory Visit (HOSPITAL_COMMUNITY)
Admission: RE | Admit: 2018-11-22 | Discharge: 2018-11-22 | Disposition: A | Discharge status: Home / Self Care | Payer: No Typology Code available for payment source | Source: Ambulatory Visit | Attending: Obstetrics and Gynecology | Admitting: Obstetrics and Gynecology

## 2018-11-22 ENCOUNTER — Encounter (HOSPITAL_COMMUNITY): Payer: Self-pay

## 2018-11-22 VITALS — BP 118/66 | Ht 62.0 in | Wt 150.0 lb

## 2018-11-22 DIAGNOSIS — Z1239 Encounter for other screening for malignant neoplasm of breast: Secondary | ICD-10-CM

## 2018-11-22 DIAGNOSIS — R2231 Localized swelling, mass and lump, right upper limb: Secondary | ICD-10-CM

## 2018-11-22 NOTE — Progress Notes (Signed)
Complaints of a right axillary lump x 5-6 years that has increased in size that is painful to the touch.  Pap Smear: Pap smear not completed today. Last Pap smear was 01/12/2016 at the [facility] and normal. Per patient has no history of an abnormal Pap smear. Last Pap smear result is in Epic.  Physical exam: Breasts Breasts symmetrical. No skin abnormalities bilateral breasts. No nipple retraction bilateral breasts. No nipple discharge bilateral breasts. No lymphadenopathy. No lumps palpated left breast. Palpated a lump within the right axilla between 8 and 10 o'clock 12 cm from the nipple. Complaints of tenderness when palpated right axillary lump. Referred patient to the [facility] for a right breast ultrasound. Appointment scheduled for Thursday, November 22, 2018 at 1120.        Pelvic/Bimanual No Pap smear completed today since last Pap smear was 01/12/2016. Pap smear not indicated per BCCCP guidelines.   Smoking History: Patient has never smoked.  Patient Navigation: Patient education provided. Access to services provided for patient through [facility] program. Spanish interpreter provided.   Breast and Cervical Cancer Risk Assessment: Patient has no family history of breast cancer, known genetic mutations, or radiation treatment to the chest before age 88. Patient has no history of cervical dysplasia, immunocompromised, or DES exposure in-utero. Breast Cancer risk assessment completed. No breast cancer risk calculated due to patient is less than 17 years old.  Used Spanish interpreter [employer] from [place].

## 2018-11-22 NOTE — Patient Instructions (Signed)
Explained breast self awareness with Cassandra Turner. Patient did not need a Pap smear today due to last Pap smear was 01/12/2016. Let her know BCCCP will cover Pap smears every 3 years unless has a history of abnormal Pap smears. Patient scheduled appointment at the free cervical cancer screening at the [facility] on Monday, January 28, 2018 at 1830. Referred patient to the [facility] for a right breast ultrasound. Appointment scheduled for Thursday, November 22, 2018 at 1120. Patient aware of appointments and will be there. Cassandra Turner verbalized understanding.  [place], Cassandra [place], RN 1:52 PM

## 2018-11-26 ENCOUNTER — Ambulatory Visit (INDEPENDENT_AMBULATORY_CARE_PROVIDER_SITE_OTHER): Payer: Self-pay | Admitting: Surgery

## 2018-11-26 ENCOUNTER — Encounter: Payer: Self-pay | Admitting: Surgery

## 2018-11-26 ENCOUNTER — Other Ambulatory Visit: Payer: Self-pay

## 2018-11-26 VITALS — BP 114/74 | HR 73 | Temp 97.3°F | Ht 62.0 in | Wt 149.8 lb

## 2018-11-26 DIAGNOSIS — D1721 Benign lipomatous neoplasm of skin and subcutaneous tissue of right arm: Secondary | ICD-10-CM

## 2018-11-26 NOTE — Progress Notes (Signed)
Outpatient Surgical Follow Up  11/26/2018  Cassandra Turner is an 32 y.o. female.   Chief Complaint  Patient presents with  . Follow-up     f/u bil breast u/s & right axillary u/s    HPI: He has a is following up for right axillary mass.  She did have ultrasound and mammogram showed no evidence of breast cancer or suspicious lesions within the breast or axilla.  This is a lipoma.  I have personally review her films.  She continues to have some intermittent discomfort and wishes to have this excised.  Past Medical History:  Diagnosis Date  . Gestational diabetes 2006  . POTS (postural orthostatic tachycardia syndrome) 10/17/2018  . Recurrent syncope 10/17/2018    Past Surgical History:  Procedure Laterality Date  . NO PAST SURGERIES      Family History  Problem Relation Age of Onset  . Cancer Mother        Thyroid Cancer  . Diabetes Sister   . Hypertension Sister   . High Cholesterol Sister   . Asthma Son   . Heart disease Father   . Anxiety disorder Father   . High Cholesterol Brother   . Diabetes Brother     Social History:  reports that she has never smoked. She has never used smokeless tobacco. She reports that she does not drink alcohol or use drugs.  Allergies: No Known Allergies  Medications reviewed.   ROS Full ROS performed and is otherwise negative other than what is stated in HPI   BP 114/74   Pulse 73   Temp (!) 97.3 F (36.3 C) (Temporal)   Ht 5\' 2"  (1.575 m)   Wt 149 lb 12.8 oz (67.9 kg)   LMP 11/18/2018 (Exact Date)   SpO2 98%   BMI 27.40 kg/m   Physical Exam NAD, alert Axilla: there is a lateral axillary soft tissue mass aprx 7 cm, soft and mobile. 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  Discussed with the patient detail she wishes to have this performed under local anesthetic here in the  office.  Procedure discussed with patient detail.  Risk, benefits and possible complications.  She wishes to proceed    Greater than 50% of the 15 minutes  visit was spent in counseling/coordination of care   Caroleen Hamman, MD [facility]

## 2018-11-26 NOTE — Patient Instructions (Signed)
Patient is to be scheduled with Dr.Pabon for lipoma removal. schedule before 12/26/2018  Call the office with any questions or concerns.

## 2018-12-03 ENCOUNTER — Encounter (HOSPITAL_COMMUNITY): Payer: Self-pay | Admitting: *Deleted

## 2018-12-12 ENCOUNTER — Other Ambulatory Visit: Payer: Self-pay

## 2018-12-12 ENCOUNTER — Encounter: Payer: Self-pay | Admitting: Surgery

## 2018-12-12 ENCOUNTER — Ambulatory Visit (INDEPENDENT_AMBULATORY_CARE_PROVIDER_SITE_OTHER): Payer: Self-pay | Admitting: Surgery

## 2018-12-12 VITALS — BP 127/83 | HR 83 | Temp 97.7°F | Resp 12 | Ht 62.0 in | Wt 151.2 lb

## 2018-12-12 DIAGNOSIS — D1721 Benign lipomatous neoplasm of skin and subcutaneous tissue of right arm: Secondary | ICD-10-CM

## 2018-12-12 NOTE — Patient Instructions (Addendum)
The patient is aware to call back for any questions or concerns. May shower Dermabond(skin glue) will gradually come off Follow up in 2 weeks

## 2018-12-13 ENCOUNTER — Encounter: Payer: Self-pay | Admitting: Surgery

## 2018-12-13 NOTE — Progress Notes (Signed)
Procedure Note  1. Excision of 7 cm right axillary soft tissue mass 2. Two layer closure 8 cm wound  Anesthesia: lidocaine 1% w epi 27cc total   Findings: large bilobar lipoma  EBL: minimal  After consent was obtained. She was prepped and draped in the standard fashion. Incision created w 15 blade and metz used to dissect the mass from adjacent tissue. Hemostasis obtained with pressure. Wound close in a two layered fashion w 3-0 vicryl and 4-0 monocryl. Dermabond was placed. No complications

## 2018-12-21 ENCOUNTER — Ambulatory Visit: Payer: Self-pay | Admitting: Internal Medicine

## 2018-12-21 ENCOUNTER — Ambulatory Visit: Payer: No Typology Code available for payment source | Admitting: Cardiovascular Disease

## 2018-12-24 ENCOUNTER — Other Ambulatory Visit: Payer: Self-pay

## 2018-12-24 ENCOUNTER — Ambulatory Visit (INDEPENDENT_AMBULATORY_CARE_PROVIDER_SITE_OTHER): Payer: Self-pay | Admitting: Surgery

## 2018-12-24 ENCOUNTER — Encounter: Payer: Self-pay | Admitting: Surgery

## 2018-12-24 VITALS — BP 114/75 | HR 87 | Temp 97.9°F | Ht 62.0 in | Wt 153.0 lb

## 2018-12-24 DIAGNOSIS — Z09 Encounter for follow-up examination after completed treatment for conditions other than malignant neoplasm: Secondary | ICD-10-CM

## 2018-12-24 NOTE — Patient Instructions (Signed)
Patient is to return to the office as needed.  Call the office with any questions or concerns. 

## 2018-12-24 NOTE — Progress Notes (Signed)
S/p excision axillary mass Doing very well  No complaints  PE NAD Incisions healing well, no infection or seroma  A/P Doing very well RTc prn

## 2019-01-02 IMAGING — US US AXILLARY RIGHT
1 series · 6 of 6 positions shown · non-contrast
Comparison: None.

CLINICAL DATA: Palpable lump within the low lateral RIGHT axilla.

EXAM:
ULTRASOUND OF THE RIGHT BREAST

[Series 1: us axillary right · 0.07mm/px · 6 of 6 slices shown]
[im 1/6]
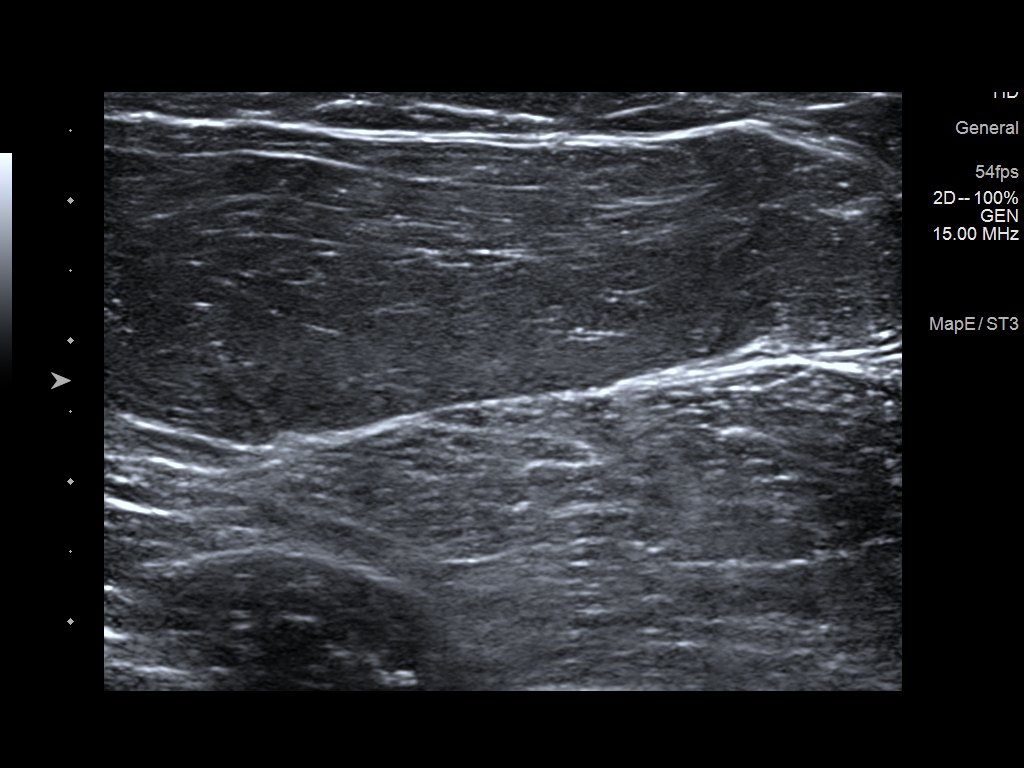
[im 2/6]
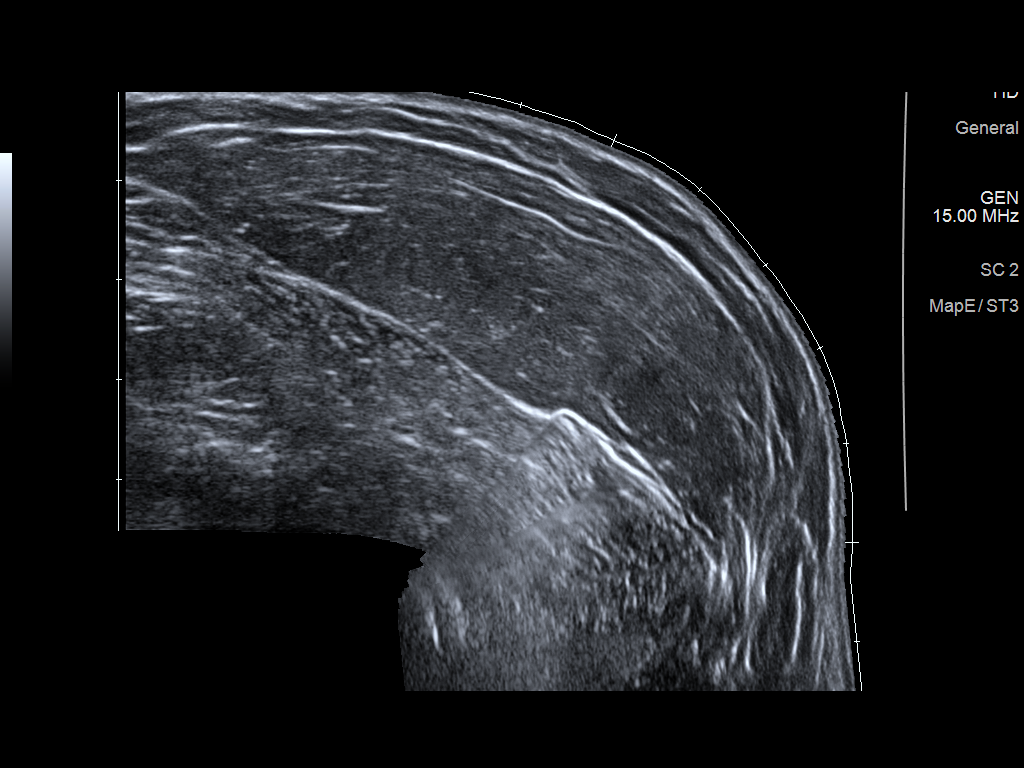
[im 3/6]
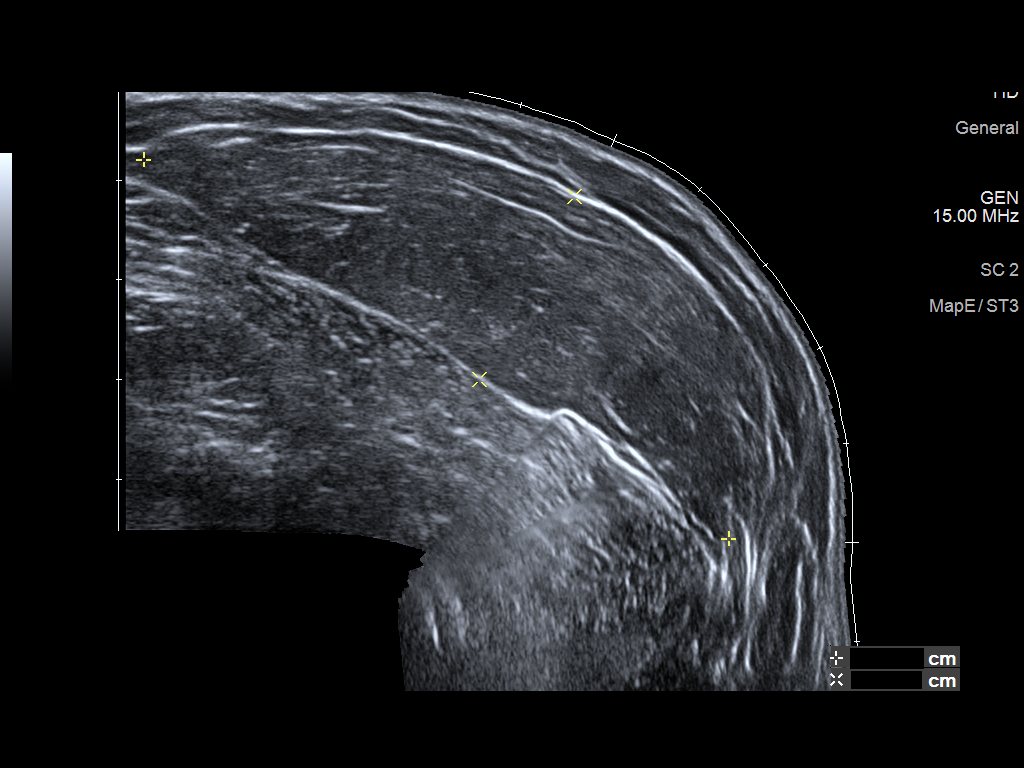
[im 4/6]
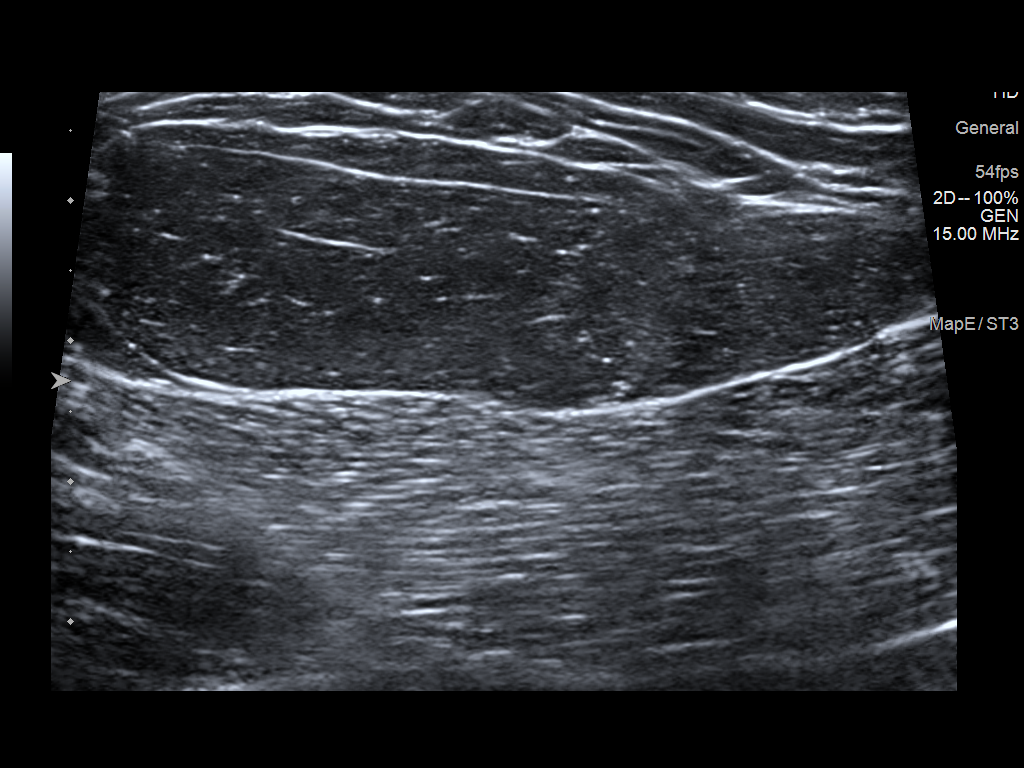
[im 5/6]
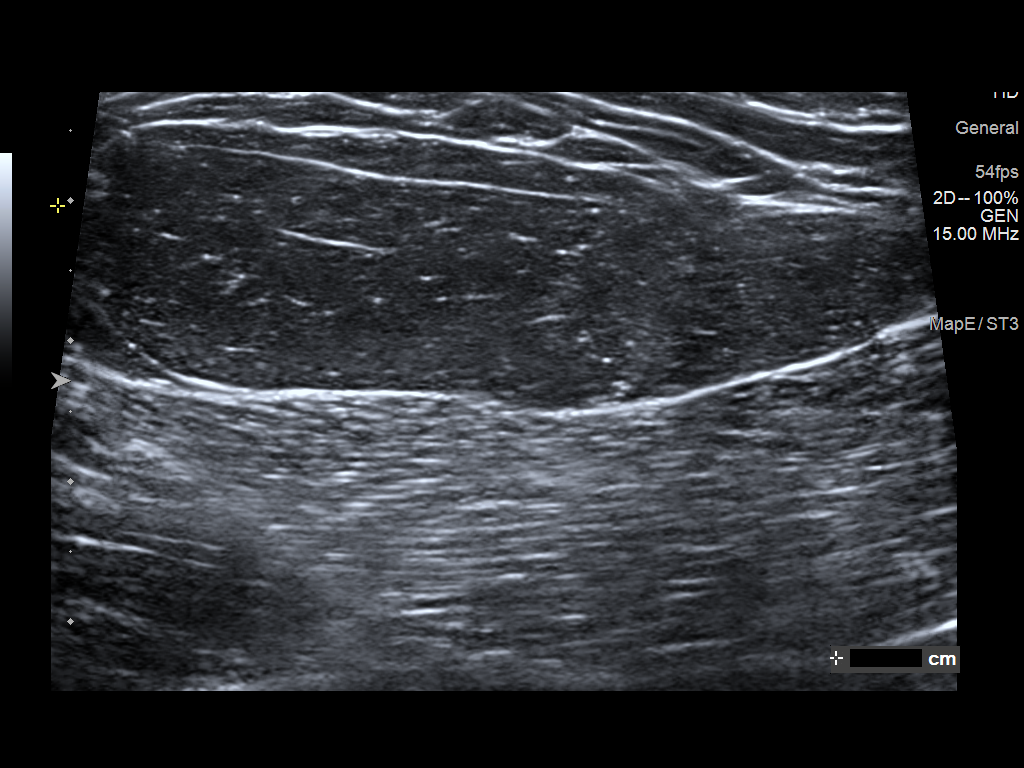
[im 6/6]
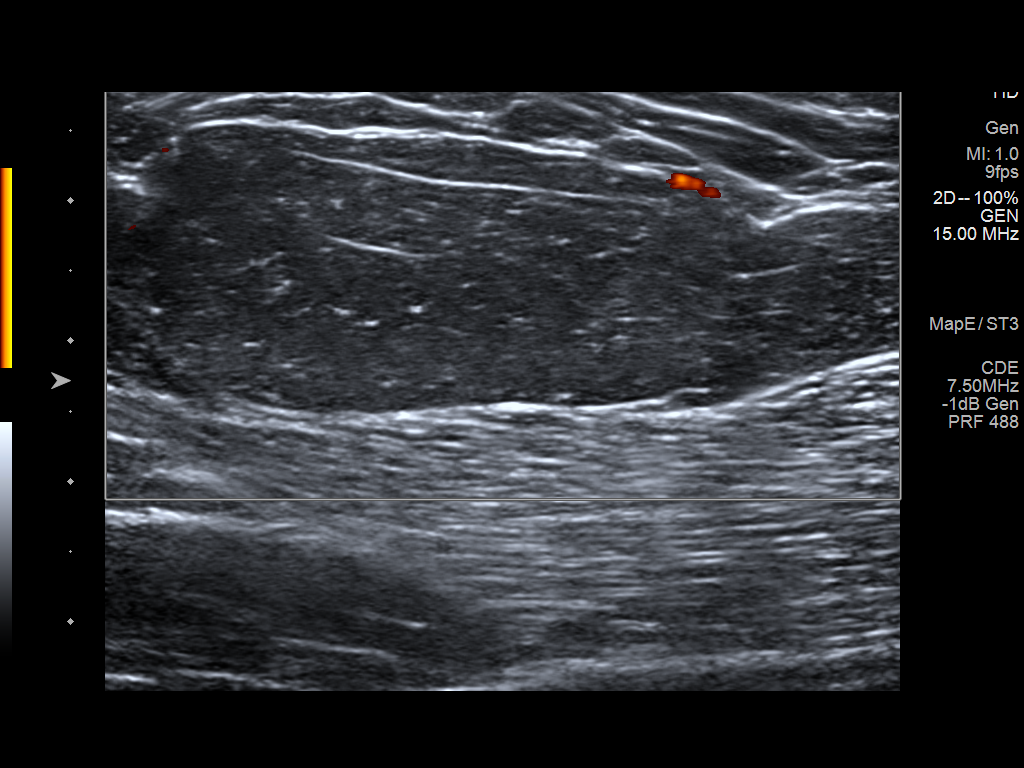

[6 of 6 positions shown; findings below may reference images not displayed]

FINDINGS: Targeted ultrasound is performed, showing a benign lipoma in the
lower lateral RIGHT axilla, measuring 7 cm greatest dimension,
corresponding to the area of clinical concern. No suspicious solid
or cystic mass. No enlarged or morphologically abnormal lymph nodes.
IMPRESSION: 1. Benign lipoma within the lower lateral RIGHT axilla, measuring 7
cm greatest dimension, corresponding to the area of clinical
concern.
2. No evidence of malignancy.

RECOMMENDATION:
1. Screening mammogram at age 38 unless there are persistent or
intervening clinical concerns. (Code:K0-D-5FH)
2. The patient was instructed to return sooner if the area that she
feels becomes larger and/or firmer to palpation, or if a new
palpable abnormality is identified in either breast.

I have discussed the findings and recommendations with the patient.
Results were also provided in writing at the conclusion of the
visit. If applicable, a reminder letter will be sent to the patient
regarding the next appointment.

BI-RADS CATEGORY  2: Benign.

## 2019-01-28 ENCOUNTER — Ambulatory Visit: Payer: No Typology Code available for payment source

## 2020-07-06 ENCOUNTER — Encounter (HOSPITAL_COMMUNITY): Payer: Self-pay

## 2020-07-06 ENCOUNTER — Other Ambulatory Visit: Payer: Self-pay

## 2020-07-06 ENCOUNTER — Ambulatory Visit (HOSPITAL_COMMUNITY)
Admission: EM | Admit: 2020-07-06 | Discharge: 2020-07-06 | Disposition: A | Discharge status: Home / Self Care | Payer: No Typology Code available for payment source | Attending: Family Medicine | Admitting: Family Medicine

## 2020-07-06 DIAGNOSIS — H6121 Impacted cerumen, right ear: Secondary | ICD-10-CM

## 2020-07-06 NOTE — ED Provider Notes (Signed)
[facility]    CSN: [id number] Arrival date & time: 07/06/20  1140      History   Chief Complaint Chief Complaint  Patient presents with  . Otalgia    HPI Cassandra Turner is a 34 y.o. female.   Cassandra Turner presents with complaints of of right ear pain, like "something is in it".  Started over a week ago and has worsened. No drainage from the ear. No fevers, no nasal drainage. Hasn't tried any treatments for this. Denies any previous similar.   Spanish video interpreter used to collect history and physical exam.       Past Medical History:  Diagnosis Date  . Gestational diabetes 2006  . POTS (postural orthostatic tachycardia syndrome) 10/17/2018  . Recurrent syncope 10/17/2018    Patient Active Problem List   Diagnosis Date Noted  . Recurrent syncope 10/17/2018  . POTS (postural orthostatic tachycardia syndrome) 10/17/2018  . Prediabetes 07/18/2018  . Indication for care in labor or delivery 05/13/2017  . Language barrier 04/03/2017  . Pregnancy, supervision, high-risk 12/19/2016  . Gestational diabetes mellitus (GDM) affecting pregnancy, antepartum 12/19/2016    Past Surgical History:  Procedure Laterality Date  . NO PAST SURGERIES      OB History    Gravida  3   Para  3   Term  3   Preterm      AB      Living  3     SAB      TAB      Ectopic      Multiple  0   Live Births  3            Home Medications    Prior to Admission medications   Not on File    Family History Family History  Problem Relation Age of Onset  . Cancer Mother        Thyroid Cancer  . Diabetes Sister   . Hypertension Sister   . High Cholesterol Sister   . Asthma Son   . Heart disease Father   . Anxiety disorder Father   . High Cholesterol Brother   . Diabetes Brother     Social History Social History   Tobacco Use  . Smoking status: Never Smoker  . Smokeless tobacco: Never Used  Vaping Use  . Vaping Use: Never  used  Substance Use Topics  . Alcohol use: No  . Drug use: No     Allergies   Patient has no known allergies.   Review of Systems Review of Systems   Physical Exam Triage Vital Signs ED Triage Vitals  Enc Vitals Group     BP 07/06/20 1300 123/74     Pulse Rate 07/06/20 1300 84     Resp 07/06/20 1300 18     Temp 07/06/20 1300 98 F (36.7 C)     Temp Source 07/06/20 1300 Oral     SpO2 07/06/20 1300 100 %     Weight --      Height --      Head Circumference --      Peak Flow --      Pain Score 07/06/20 1301 5     Pain Loc --      Pain Edu? --      Excl. in [place]? --    No data found.  Updated Vital Signs BP 110/79 (BP Location: Right Arm)   Pulse 70   Temp 98.4 F (36.9 C) (Oral)  Resp 17   LMP 07/04/2020   SpO2 97%   Visual Acuity Right Eye Distance:   Left Eye Distance:   Bilateral Distance:    Right Eye Near:   Left Eye Near:    Bilateral Near:     Physical Exam Constitutional:      General: She is not in acute distress.    Appearance: She is well-developed.  HENT:     Right Ear: Tympanic membrane and ear canal normal. There is impacted cerumen.     Ears:     Comments: Initial exam with right ear with impacted cerumen, irrigated per nursing staff with complete removal and clear canal, patient reports subjective improvement as well  Cardiovascular:     Rate and Rhythm: Normal rate.  Pulmonary:     Effort: Pulmonary effort is normal.  Skin:    General: Skin is warm and dry.  Neurological:     Mental Status: She is alert and oriented to person, place, and time.      UC Treatments / Results  Labs (all labs ordered are listed, but only abnormal results are displayed) Labs Reviewed - No data to display  EKG   Radiology No results found.  Procedures Procedures (including critical care time)  Medications Ordered in UC Medications - No data to display  Initial Impression / Assessment and Plan / UC Course  I have reviewed the triage  vital signs and the nursing notes.  Pertinent labs & imaging results that were available during my care of the patient were reviewed by me and considered in my medical decision making (see chart for details).     Cerumen impaction with successful irrigation.  Final Clinical Impressions(s) / UC Diagnoses   Final diagnoses:  Impacted cerumen of right ear   Discharge Instructions   None    ED Prescriptions    None     PDMP not reviewed this encounter.   Zigmund Gottron, NP 07/06/20 1447

## 2020-07-06 NOTE — ED Triage Notes (Signed)
Pt presents with right ear pain and fullness X 1 week.

## 2020-12-22 ENCOUNTER — Encounter (HOSPITAL_COMMUNITY): Payer: Self-pay | Admitting: Urgent Care

## 2020-12-22 ENCOUNTER — Ambulatory Visit (HOSPITAL_COMMUNITY)
Admission: EM | Admit: 2020-12-22 | Discharge: 2020-12-22 | Disposition: A | Discharge status: Home / Self Care | Payer: Self-pay | Attending: Urgent Care | Admitting: Urgent Care

## 2020-12-22 DIAGNOSIS — N3001 Acute cystitis with hematuria: Secondary | ICD-10-CM | POA: Insufficient documentation

## 2020-12-22 DIAGNOSIS — R300 Dysuria: Secondary | ICD-10-CM | POA: Insufficient documentation

## 2020-12-22 LAB — POCT URINALYSIS DIPSTICK, ED / UC
Bilirubin Urine: NEGATIVE
Glucose, UA: NEGATIVE mg/dL
Ketones, ur: NEGATIVE mg/dL
Nitrite: NEGATIVE
Protein, ur: NEGATIVE mg/dL
Specific Gravity, Urine: 1.01 (ref 1.005–1.030)
Urobilinogen, UA: 0.2 mg/dL (ref 0.0–1.0)
pH: 6 (ref 5.0–8.0)

## 2020-12-22 LAB — POC URINE PREG, ED: Preg Test, Ur: NEGATIVE

## 2020-12-22 MED ORDER — CEPHALEXIN 500 MG PO CAPS: 500.0000 mg | ORAL_CAPSULE | Freq: Two times a day (BID) | ORAL | 0 refills | Status: DC

## 2020-12-22 NOTE — ED Triage Notes (Signed)
Pt here for UTI sx onset yesterday associated w/dysuria, abd pain  Denies fevers, chills, n/v/d, vag d/c  A&O x4... NAD.Marland Kitchen. ambulatory

## 2020-12-22 NOTE — ED Provider Notes (Signed)
[name]   MRN: [id number] DOB: 02-22-1986  Subjective:   Cassandra Turner is a 35 y.o. female presenting for 1 day history of acute onset dysuria, lower pelvic pain, urinary frequency.  Denies fever, nausea, vomiting, vaginal discharge, hematuria, flank pain.  Patient has been hydrating very well, but normally does anyway.  Does not drink any urinary irritants.  No current facility-administered medications for this encounter. No current outpatient medications on file.   No Known Allergies  Past Medical History:  Diagnosis Date  . Gestational diabetes 2006  . POTS (postural orthostatic tachycardia syndrome) 10/17/2018  . Recurrent syncope 10/17/2018     Past Surgical History:  Procedure Laterality Date  . NO PAST SURGERIES      Family History  Problem Relation Age of Onset  . Cancer Mother        Thyroid Cancer  . Diabetes Sister   . Hypertension Sister   . High Cholesterol Sister   . Asthma Son   . Heart disease Father   . Anxiety disorder Father   . High Cholesterol Brother   . Diabetes Brother     Social History   Tobacco Use  . Smoking status: Never Smoker  . Smokeless tobacco: Never Used  Vaping Use  . Vaping Use: Never used  Substance Use Topics  . Alcohol use: No  . Drug use: No    ROS   Objective:   Vitals: BP 137/84 (BP Location: Right Arm)   Pulse 89   Temp 99.2 F (37.3 C) (Oral)   Resp 16   LMP 12/19/2020   SpO2 98%   Physical Exam Constitutional:      General: She is not in acute distress.    Appearance: Normal appearance. She is well-developed. She is not ill-appearing.  HENT:     Head: Normocephalic and atraumatic.     Nose: Nose normal.     Mouth/Throat:     Mouth: Mucous membranes are moist.     Pharynx: Oropharynx is clear.  Eyes:     General: No scleral icterus.    Extraocular Movements: Extraocular movements intact.     Pupils: Pupils are equal, round, and reactive to light.   Cardiovascular:     Rate and Rhythm: Normal rate.  Pulmonary:     Effort: Pulmonary effort is normal.  Abdominal:     Tenderness: There is no right CVA tenderness or left CVA tenderness.  Skin:    General: Skin is warm and dry.  Neurological:     General: No focal deficit present.     Mental Status: She is alert and oriented to person, place, and time.  Psychiatric:        Mood and Affect: Mood normal.        Behavior: Behavior normal.     Results for orders placed or performed during the hospital encounter of 12/22/20 (from the past 24 hour(s))  POC Urinalysis dipstick     Status: Abnormal   Collection Time: 12/22/20 10:58 AM  Result Value Ref Range   Glucose, UA NEGATIVE NEGATIVE mg/dL   Bilirubin Urine NEGATIVE NEGATIVE   Ketones, ur NEGATIVE NEGATIVE mg/dL   Specific Gravity, Urine 1.010 1.005 - 1.030   Hgb urine dipstick SMALL (A) NEGATIVE   pH 6.0 5.0 - 8.0   Protein, ur NEGATIVE NEGATIVE mg/dL   Urobilinogen, UA 0.2 0.0 - 1.0 mg/dL   Nitrite NEGATIVE NEGATIVE   Leukocytes,Ua SMALL (A) NEGATIVE  POC urine pregnancy  Status: None   Collection Time: 12/22/20 11:02 AM  Result Value Ref Range   Preg Test, Ur NEGATIVE NEGATIVE    Assessment and Plan :   PDMP not reviewed this encounter.  1. Acute cystitis with hematuria   2. Dysuria     No signs of pyelonephritis.  Start Keflex to cover for acute cystitis, urine culture pending.  Recommended aggressive hydration, limiting urinary irritants. Counseled patient on potential for adverse effects with medications prescribed/recommended today, ER and return-to-clinic precautions discussed, patient verbalized understanding.    Jaynee Eagles, PA-C 12/22/20 1116

## 2020-12-22 NOTE — Discharge Instructions (Signed)

## 2020-12-23 LAB — URINE CULTURE

## 2020-12-24 LAB — URINE CULTURE: Culture: 50000 — AB

## 2020-12-25 ENCOUNTER — Telehealth (HOSPITAL_COMMUNITY): Payer: Self-pay | Admitting: Emergency Medicine

## 2020-12-25 MED ORDER — FOSFOMYCIN TROMETHAMINE 3 G PO PACK: 3.0000 g | PACK | Freq: Once | ORAL | 0 refills | Status: AC

## 2022-06-02 ENCOUNTER — Ambulatory Visit (HOSPITAL_COMMUNITY)
Admission: EM | Admit: 2022-06-02 | Discharge: 2022-06-02 | Disposition: A | Discharge status: Home / Self Care | Payer: No Typology Code available for payment source | Attending: Internal Medicine | Admitting: Internal Medicine

## 2022-06-02 ENCOUNTER — Encounter (HOSPITAL_COMMUNITY): Payer: Self-pay | Admitting: Emergency Medicine

## 2022-06-02 DIAGNOSIS — R42 Dizziness and giddiness: Secondary | ICD-10-CM

## 2022-06-02 LAB — POCT URINALYSIS DIPSTICK, ED / UC
Bilirubin Urine: NEGATIVE
Glucose, UA: NEGATIVE mg/dL
Ketones, ur: NEGATIVE mg/dL
Leukocytes,Ua: NEGATIVE
Nitrite: NEGATIVE
Protein, ur: NEGATIVE mg/dL
Specific Gravity, Urine: 1.01 (ref 1.005–1.030)
Urobilinogen, UA: 0.2 mg/dL (ref 0.0–1.0)
pH: 6 (ref 5.0–8.0)

## 2022-06-02 LAB — POC URINE PREG, ED: Preg Test, Ur: NEGATIVE

## 2022-06-02 LAB — CBG MONITORING, ED: Glucose-Capillary: 94 mg/dL (ref 70–99)

## 2022-06-02 MED ORDER — MECLIZINE HCL 12.5 MG PO TABS: 12.5000 mg | ORAL_TABLET | Freq: Three times a day (TID) | ORAL | 0 refills | Status: DC | PRN

## 2022-06-02 NOTE — ED Triage Notes (Signed)
Pt is present today with concerns for HA, SOB, and dizziness. Pt is denies any nausea or vomiting. Pt sx started last Friday

## 2022-06-02 NOTE — ED Provider Notes (Signed)
[facility]    CSN: [id number] Arrival date & time: 06/02/22  1608      History   Chief Complaint Chief Complaint  Patient presents with   Dizziness   Headache    HPI Cassandra Turner is a 36 y.o. female.   Patient presents urgent care for evaluation of dizziness that has been going on for the last 7 days.  She states that the room feels like it spinning and her symptoms are worse with up and down head movement. She is currently on her menstrual cycle, but denies heavy bleeding and states that her amount of bleeding is normal. Menstrual cycle started yesterday. Denies urinary frequency, urgency, and dysuria.  Reports that she sometimes sees white spots when she becomes dizzy and dizziness is worse when she stands up from a seated position. She also sometimes becomes nauseous, but never vomits during dizziness episodes.  States that she drinks "a ton of water" and denies drinking caffeine and sodas.  Over the last 7 days, she has also developed some shortness of breath with walking short distances and some chills without documented fever.  Reports that it is difficult for her to walk up a flight of stairs without becoming winded. Denies headache, chest pain, weakness, abdominal pain, back pain, and feeling shaky.  Denies correlation of symptoms with eating and denies current diagnosis of diabetes. She had gestational diabetes while she was pregnant, but receives care and screenings for this at the health department in [place] where she also gets her birth control pills. No reported history of cardiac or respiratory problems. No history of vertigo or neurologic problems.    Dizziness Associated symptoms: headaches   Headache Associated symptoms: dizziness     Past Medical History:  Diagnosis Date   Gestational diabetes 2006   POTS (postural orthostatic tachycardia syndrome) 10/17/2018   Recurrent syncope 10/17/2018    Patient Active Problem List   Diagnosis  Date Noted   Recurrent syncope 10/17/2018   POTS (postural orthostatic tachycardia syndrome) 10/17/2018   Prediabetes 07/18/2018   Indication for care in labor or delivery 05/13/2017   Language barrier 04/03/2017   Pregnancy, supervision, high-risk 12/19/2016   Gestational diabetes mellitus (GDM) affecting pregnancy, antepartum 12/19/2016    Past Surgical History:  Procedure Laterality Date   NO PAST SURGERIES      OB History     Gravida  3   Para  3   Term  3   Preterm      AB      Living  3      SAB      IAB      Ectopic      Multiple  0   Live Births  3            Home Medications    Prior to Admission medications   Medication Sig Start Date End Date Taking? Authorizing Provider  meclizine (ANTIVERT) 12.5 MG tablet Take 1 tablet (12.5 mg total) by mouth 3 (three) times daily as needed for dizziness. 06/02/22  Yes Talbot Grumbling, FNP  cephALEXin (KEFLEX) 500 MG capsule Take 1 capsule (500 mg total) by mouth 2 (two) times daily. 12/22/20   Jaynee Eagles, PA-C    Family History Family History  Problem Relation Age of Onset   Cancer Mother        Thyroid Cancer   Diabetes Sister    Hypertension Sister    High Cholesterol Sister    Asthma Son  Heart disease Father    Anxiety disorder Father    High Cholesterol Brother    Diabetes Brother     Social History Social History   Tobacco Use   Smoking status: Never   Smokeless tobacco: Never  Vaping Use   Vaping Use: Never used  Substance Use Topics   Alcohol use: No   Drug use: No     Allergies   Patient has no known allergies.   Review of Systems Review of Systems  Neurological:  Positive for dizziness and headaches.  Per HPI   Physical Exam Triage Vital Signs ED Triage Vitals [06/02/22 1652]  Enc Vitals Group     BP 126/75     Pulse Rate 76     Resp 18     Temp 98.4 F (36.9 C)     Temp src      SpO2 100 %     Weight      Height      Head Circumference       Peak Flow      Pain Score      Pain Loc      Pain Edu?      Excl. in [place]?    No data found.  Updated Vital Signs BP 126/75   Pulse 76   Temp 98.4 F (36.9 C)   Resp 18   SpO2 100%   Visual Acuity Right Eye Distance:   Left Eye Distance:   Bilateral Distance:    Right Eye Near:   Left Eye Near:    Bilateral Near:     Physical Exam Vitals and nursing note reviewed.  Constitutional:      Appearance: Normal appearance. She is not ill-appearing or toxic-appearing.     Comments: Very pleasant patient sitting on exam in position of comfort table in no acute distress.   HENT:     Head: Normocephalic and atraumatic.     Right Ear: Hearing, tympanic membrane, ear canal and external ear normal.     Left Ear: Hearing, tympanic membrane, ear canal and external ear normal.     Nose: Nose normal.     Mouth/Throat:     Lips: Pink.     Mouth: Mucous membranes are moist.  Eyes:     General: Lids are normal. Vision grossly intact. Gaze aligned appropriately. No visual field deficit.    Extraocular Movements: Extraocular movements intact.     Conjunctiva/sclera: Conjunctivae normal.     Right eye: Right conjunctiva is not injected.     Left eye: Left conjunctiva is not injected.  Cardiovascular:     Rate and Rhythm: Normal rate and regular rhythm.     Heart sounds: Normal heart sounds, S1 normal and S2 normal.  Pulmonary:     Effort: Pulmonary effort is normal. No respiratory distress.     Breath sounds: Normal breath sounds and air entry.  Abdominal:     General: Bowel sounds are normal.     Palpations: Abdomen is soft.     Tenderness: There is no abdominal tenderness. There is no right CVA tenderness, left CVA tenderness or guarding.  Musculoskeletal:     Cervical back: Neck supple.  Skin:    General: Skin is warm and dry.     Capillary Refill: Capillary refill takes less than 2 seconds.     Findings: No rash.  Neurological:     General: No focal deficit present.     Mental  Status: She is alert  and oriented to person, place, and time. Mental status is at baseline.     Cranial Nerves: Cranial nerves 2-12 are intact. No dysarthria or facial asymmetry.     Sensory: Sensation is intact.     Motor: Motor function is intact. No weakness or pronator drift.     Coordination: Coordination is intact. Romberg sign negative. Coordination normal. Finger-Nose-Finger Test normal.     Gait: Gait is intact.     Comments: 5/5 grip strength to bilateral upper extremities.  5/5 strength against resistance with abduction and abduction of bilateral upper and lower extremities.  No focal neurologic deficit identified.    Psychiatric:        Mood and Affect: Mood normal.        Speech: Speech normal.        Behavior: Behavior normal.        Thought Content: Thought content normal.        Judgment: Judgment normal.      UC Treatments / Results  Labs (all labs ordered are listed, but only abnormal results are displayed) Labs Reviewed  POCT URINALYSIS DIPSTICK, ED / UC - Abnormal; Notable for the following components:      Result Value   Hgb urine dipstick TRACE (*)    All other components within normal limits  CBG MONITORING, ED  POC URINE PREG, ED    EKG   Radiology No results found.  Procedures Procedures (including critical care time)  Medications Ordered in UC Medications - No data to display  Initial Impression / Assessment and Plan / UC Course  I have reviewed the triage vital signs and the nursing notes.  Pertinent labs & imaging results that were available during my care of the patient were reviewed by me and considered in my medical decision making (see chart for details).  1.  Dizziness Symptomology and physical exam are consistent with benign positional vertigo.  Low suspicion for acute cerebrovascular or cardiovascular cause to patient's dizziness given stable physical exam and vital signs. Doubt acute blood loss due to menstrual cycle as cause of  dizziness as patient's skin does not appear pale and she is not tachycardic.  Non fasting CBG obtained due to patient's history of gestational diabetes and stable at 20.  Urinalysis shows trace hematuria with normal specific gravity.  EKG obtained due to subjective history of shortness of breath with walking short distances as well as some difficulty taking a deep breath.  No acute coronary abnormality to EKG with ventricular rate of 72.    Plan to treat for benign positional vertigo with meclizine to be taken every 8 hours as needed for dizziness. Encouraged patient to continue drinking plenty of water to stay well-hydrated.  She is not to take meclizine and drive or drink alcohol as it can make her sleepy.  Patient given walking referral to neurologist for further evaluation of dizziness.   Discussed physical exam and available lab work findings in clinic with patient.  Counseled patient regarding appropriate use of medications and potential side effects for all medications recommended or prescribed today. Discussed red flag signs and symptoms of worsening condition,when to call the PCP office, return to urgent care, and when to seek higher level of care in the emergency department. Patient verbalizes understanding and agreement with plan. All questions answered. Patient discharged in stable condition.  Final Clinical Impressions(s) / UC Diagnoses   Final diagnoses:  Dizziness  Vertigo     Discharge Instructions  You likely have benign positional vertigo.  Please take meclizine every 8 hours as needed for dizziness.  Do not take this medication and drive or drink alcohol as it can make you sleepy.   Call the neurologist on your paperwork for a follow-up appointment for further work-up if your symptoms do not improve.   Your EKG, urine, and blood sugar are all normal today.  This is very reassuring.  If you develop any new or worsening symptoms or do not improve in the next 2 to 3 days,  please return.  If your symptoms are severe, please go to the emergency room.  Follow-up with your primary care provider for further evaluation and management of your symptoms as well as ongoing wellness visits.  I hope you feel better!     ED Prescriptions     Medication Sig Dispense Auth. Provider   meclizine (ANTIVERT) 12.5 MG tablet Take 1 tablet (12.5 mg total) by mouth 3 (three) times daily as needed for dizziness. 30 tablet Talbot Grumbling, FNP      PDMP not reviewed this encounter.   Talbot Grumbling, [place] 06/02/22 1805

## 2022-06-02 NOTE — Discharge Instructions (Signed)
You likely have benign positional vertigo.  Please take meclizine every 8 hours as needed for dizziness.  Do not take this medication and drive or drink alcohol as it can make you sleepy.   Call the neurologist on your paperwork for a follow-up appointment for further work-up if your symptoms do not improve.   Your EKG, urine, and blood sugar are all normal today.  This is very reassuring.  If you develop any new or worsening symptoms or do not improve in the next 2 to 3 days, please return.  If your symptoms are severe, please go to the emergency room.  Follow-up with your primary care provider for further evaluation and management of your symptoms as well as ongoing wellness visits.  I hope you feel better!

## 2022-06-03 ENCOUNTER — Encounter (HOSPITAL_COMMUNITY): Payer: Self-pay

## 2022-06-06 ENCOUNTER — Encounter: Payer: Self-pay | Admitting: General Practice

## 2022-11-19 ENCOUNTER — Encounter (HOSPITAL_COMMUNITY): Payer: Self-pay

## 2022-11-19 ENCOUNTER — Ambulatory Visit (HOSPITAL_COMMUNITY)
Admission: EM | Admit: 2022-11-19 | Discharge: 2022-11-19 | Disposition: A | Discharge status: Home / Self Care | Payer: No Typology Code available for payment source | Attending: Emergency Medicine | Admitting: Emergency Medicine

## 2022-11-19 DIAGNOSIS — R051 Acute cough: Secondary | ICD-10-CM

## 2022-11-19 DIAGNOSIS — J029 Acute pharyngitis, unspecified: Secondary | ICD-10-CM

## 2022-11-19 LAB — POCT RAPID STREP A, ED / UC: Streptococcus, Group A Screen (Direct): NEGATIVE

## 2022-11-19 MED ORDER — IBUPROFEN 800 MG PO TABS: 800.0000 mg | ORAL_TABLET | Freq: Four times a day (QID) | ORAL | 0 refills | Status: AC | PRN

## 2022-11-19 MED ORDER — IBUPROFEN 800 MG PO TABS
800.0000 mg | ORAL_TABLET | Freq: Once | ORAL | Status: AC
  Administered 2022-11-19: 800 mg via ORAL

## 2022-11-19 MED ORDER — IBUPROFEN 800 MG PO TABS
ORAL_TABLET | ORAL | Status: AC
  Filled 2022-11-19: qty 1

## 2022-11-19 NOTE — ED Triage Notes (Signed)
Patient with c/o cough and sore throat. States she feels like her throat is swollen. Denies fever.

## 2022-11-19 NOTE — ED Provider Notes (Signed)
[facility]    CSN: [id number] Arrival date & time: 11/19/22  1058      History   Chief Complaint Chief Complaint  Patient presents with   Cough   Sore Throat    HPI Cassandra Turner is a 36 y.o. female.  Medical interpretor used for this encounter Presents with 4 day history of sore throat and cough Feels like her throat is swelling. Denies trouble swallowing or breathing. Today 5/10 pain No known fever  Took tylenol that temporarily helped  Daughter sick with fever and similar symptoms   Past Medical History:  Diagnosis Date   Gestational diabetes 2006   POTS (postural orthostatic tachycardia syndrome) 10/17/2018   Recurrent syncope 10/17/2018    Patient Active Problem List   Diagnosis Date Noted   Recurrent syncope 10/17/2018   POTS (postural orthostatic tachycardia syndrome) 10/17/2018   Prediabetes 07/18/2018   Indication for care in labor or delivery 05/13/2017   Language barrier 04/03/2017   Pregnancy, supervision, high-risk 12/19/2016   Gestational diabetes mellitus (GDM) affecting pregnancy, antepartum 12/19/2016    Past Surgical History:  Procedure Laterality Date   NO PAST SURGERIES      OB History     Gravida  3   Para  3   Term  3   Preterm      AB      Living  3      SAB      IAB      Ectopic      Multiple  0   Live Births  3            Home Medications    Prior to Admission medications   Medication Sig Start Date End Date Taking? Authorizing Provider  ibuprofen (ADVIL) 800 MG tablet Take 1 tablet (800 mg total) by mouth every 6 (six) hours as needed. Cada 6 horas segun sea necesario 11/19/22  Yes Iley Deignan, Wells Guiles, PA-C    Family History Family History  Problem Relation Age of Onset   Cancer Mother        Thyroid Cancer   Diabetes Sister    Hypertension Sister    High Cholesterol Sister    Asthma Son    Heart disease Father    Anxiety disorder Father    High Cholesterol Brother     Diabetes Brother     Social History Social History   Tobacco Use   Smoking status: Never   Smokeless tobacco: Never  Vaping Use   Vaping Use: Never used  Substance Use Topics   Alcohol use: No   Drug use: No     Allergies   Patient has no known allergies.   Review of Systems Review of Systems Per HPI  Physical Exam Triage Vital Signs ED Triage Vitals [11/19/22 1301]  Enc Vitals Group     BP 118/83     Pulse Rate 89     Resp 18     Temp 98.4 F (36.9 C)     Temp Source Oral     SpO2 100 %     Weight      Height      Head Circumference      Peak Flow      Pain Score 5     Pain Loc      Pain Edu?      Excl. in [place]?    No data found.  Updated Vital Signs BP 118/83 (BP Location: Left Arm)  Pulse 89   Temp 98.4 F (36.9 C) (Oral)   Resp 18   LMP 11/15/2022 (Exact Date)   SpO2 100%   Physical Exam Vitals and nursing note reviewed.  Constitutional:      General: She is not in acute distress. HENT:     Mouth/Throat:     Mouth: Mucous membranes are moist.     Pharynx: Uvula midline. Posterior oropharyngeal erythema present.     Tonsils: No tonsillar exudate or tonsillar abscesses. 1+ on the right. 1+ on the left.  Eyes:     Conjunctiva/sclera: Conjunctivae normal.     Pupils: Pupils are equal, round, and reactive to light.  Cardiovascular:     Rate and Rhythm: Normal rate and regular rhythm.     Heart sounds: Normal heart sounds.  Pulmonary:     Effort: Pulmonary effort is normal.     Breath sounds: Normal breath sounds.  Musculoskeletal:     Cervical back: Normal range of motion.  Lymphadenopathy:     Cervical: No cervical adenopathy.  Neurological:     Mental Status: She is alert and oriented to person, place, and time.     UC Treatments / Results  Labs (all labs ordered are listed, but only abnormal results are displayed) Labs Reviewed  POCT RAPID STREP A, ED / UC    EKG   Radiology No results found.  Procedures Procedures  (including critical care time)  Medications Ordered in UC Medications  ibuprofen (ADVIL) tablet 800 mg (800 mg Oral Given 11/19/22 1414)    Initial Impression / Assessment and Plan / UC Course  I have reviewed the triage vital signs and the nursing notes.  Pertinent labs & imaging results that were available during my care of the patient were reviewed by me and considered in my medical decision making (see chart for details).  Negative strep test. Afebrile here, well appearing, occasional cough  Ibuprofen dose given for pain. Discussed viral etiology, recommend alternating tylenol and ibuprofen. Can try honey, lozenges, lots of fluids. Mucinex for congestion/cough. Discussed symptoms may last several more days but should improve. She will return if anything worsens.  Final Clinical Impressions(s) / UC Diagnoses   Final diagnoses:  Viral pharyngitis  Acute cough     Discharge Instructions      Puedes tomar ibuprofeno hasta 800 mg cada 6 horas para el dolor de garganta. Pruebe tambin mucinex, miel y pastillas para la tos. bebe muchos lquidos!     ED Prescriptions     Medication Sig Dispense Auth. Provider   ibuprofen (ADVIL) 800 MG tablet Take 1 tablet (800 mg total) by mouth every 6 (six) hours as needed. Cada 6 horas segun sea necesario 21 tablet Josphine Laffey, Wells Guiles, PA-C      PDMP not reviewed this encounter.   [place], [place] 11/19/22 1454

## 2022-11-19 NOTE — Discharge Instructions (Signed)
Puedes tomar ibuprofeno hasta 800 mg cada 6 horas para el dolor de garganta. Pruebe tambin mucinex, miel y pastillas para la tos. bebe muchos lquidos!
